# Patient Record
Sex: Female | Born: 2005 | Race: Black or African American | Hispanic: No | Marital: Single | Smoking: Never smoker
Health system: Southern US, Community
[De-identification: ages and names within clinical notes are randomized; demographics above are authoritative.]

## PROBLEM LIST (undated history)

## (undated) DIAGNOSIS — F419 Anxiety disorder, unspecified: Secondary | ICD-10-CM

## (undated) DIAGNOSIS — K589 Irritable bowel syndrome without diarrhea: Secondary | ICD-10-CM

## (undated) DIAGNOSIS — J45909 Unspecified asthma, uncomplicated: Secondary | ICD-10-CM

## (undated) HISTORY — DX: Unspecified asthma, uncomplicated: J45.909

## (undated) HISTORY — DX: Irritable bowel syndrome, unspecified: K58.9

## (undated) HISTORY — DX: Anxiety disorder, unspecified: F41.9

---
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
## 2023-01-01 ENCOUNTER — Ambulatory Visit (INDEPENDENT_AMBULATORY_CARE_PROVIDER_SITE_OTHER): Payer: Medicaid Other | Admitting: Licensed Clinical Social Worker

## 2023-01-01 DIAGNOSIS — F4323 Adjustment disorder with mixed anxiety and depressed mood: Secondary | ICD-10-CM

## 2023-01-01 DIAGNOSIS — F4320 Adjustment disorder, unspecified: Secondary | ICD-10-CM | POA: Diagnosis not present

## 2023-01-01 NOTE — BH Specialist Note (Signed)
Integrated Behavioral Health via Telemedicine Visit  01/01/2023 Madison Watson [id number]  Number of Integrated Behavioral Health Clinician visits: 2- Second Visit  Session Start time: [phone]   Session End time: 1059  Total time in minutes: 70   Referring Provider: Beatriz Stallion NP Patient/Family location: [place], [facility]  [facility] Provider location: [facility]  All persons participating in visit: Patient  Types of Service: Individual psychotherapy and Video visit  I connected with Madison Watson and/or Madison Watson's patient via  Telephone or Video Enabled Telemedicine Application  (Video is Caregility application) and verified that I am speaking with the correct person using two identifiers. Discussed confidentiality: Yes   I discussed the limitations of telemedicine and the availability of in person appointments.  Discussed there is a possibility of technology failure and discussed alternative modes of communication if that failure occurs.  I discussed that engaging in this telemedicine visit, they consent to the provision of behavioral healthcare and the services will be billed under their insurance.  Patient and/or legal guardian expressed understanding and consented to Telemedicine visit: Yes   Presenting Concerns: Patient and/or family reports the following symptoms/concerns: continued stress with adjustments to college, relationship stress   Duration of problem: months; Severity of problem: moderate  Patient and/or Family's Strengths/Protective Factors: Social connections, Concrete supports in place (healthy food, safe environments, etc.), and Close, supportive relationship with mother, History of IEP    Goals Addressed: Patient will: Reduce symptoms of: stress and inattention Increase knowledge and/or ability of: coping skills  Demonstrate ability to: Increase adequate support systems for patient/family through accessing accommodations for college    Progress  towards Goals: Ongoing   Interventions: Interventions utilized: Solution-Focused Strategies, Supportive Counseling, Psychoeducation and/or Health Education, and Supportive Reflection Standardized Assessments completed: Not Needed  Patient and/or Family Response: Patient worked to process recent conversation with friend about change of living arrangements and patient's emotions related to upcoming changes. Patient reported that she has been keeping to herself more in order to focus on her own goals and plans. Patient reported stress related to application to college and worked to process recent stressors within the family. Patient was able to identify positive coping strategies to manage stress. Patient engaged in discussion of recent request to transfer [facility] services to [facility], and options for other referrals if desired since location does not have openings. Patient reported interest in scheduling follow up with this [facility], but was uncertain of school scheduled. Patient agreed to send message if reschedule was needed.   Assessment: Patient currently experiencing continued stress with adjustment to college and relationships.   Patient may benefit from continued connection to behavioral health services to process emotions related to stressors and support positive coping and adjustments.  Plan: Follow up with behavioral health clinician on : 8/22 at 10 am virtually- patient messaged to indicate reschedule is needed  Behavioral recommendations: Continued to focus on yourself and your goals. Take time do activities that you enjoy Referral(s): Integrated [employer] (In Clinic). Discussed options for referral if desired  I discussed the assessment and treatment plan with the patient and/or parent/guardian. They were provided an opportunity to ask questions and all were answered. They agreed with the plan and demonstrated an understanding of the instructions.   They were advised to call  back or seek an in-person evaluation if the symptoms worsen or if the condition fails to improve as anticipated.  Madison Watson, [facility]

## 2023-01-05 ENCOUNTER — Other Ambulatory Visit (HOSPITAL_BASED_OUTPATIENT_CLINIC_OR_DEPARTMENT_OTHER): Payer: Self-pay | Admitting: Family Medicine

## 2023-01-05 MED ORDER — DESONIDE 0.05 % EX OINT: 1.0000 | TOPICAL_OINTMENT | Freq: Two times a day (BID) | CUTANEOUS | 2 refills | Status: AC

## 2023-01-05 MED ORDER — FLUOXETINE HCL 10 MG PO CAPS: 10.0000 mg | ORAL_CAPSULE | Freq: Every day | ORAL | 3 refills | Status: DC

## 2023-01-10 DIAGNOSIS — Z872 Personal history of diseases of the skin and subcutaneous tissue: Secondary | ICD-10-CM | POA: Diagnosis not present

## 2023-01-10 DIAGNOSIS — Z8709 Personal history of other diseases of the respiratory system: Secondary | ICD-10-CM | POA: Diagnosis not present

## 2023-01-10 DIAGNOSIS — T7840XA Allergy, unspecified, initial encounter: Secondary | ICD-10-CM | POA: Diagnosis not present

## 2023-01-10 DIAGNOSIS — Z9109 Other allergy status, other than to drugs and biological substances: Secondary | ICD-10-CM | POA: Diagnosis not present

## 2023-01-15 ENCOUNTER — Encounter: Payer: Medicaid Other | Admitting: Licensed Clinical Social Worker

## 2023-01-27 ENCOUNTER — Ambulatory Visit (HOSPITAL_BASED_OUTPATIENT_CLINIC_OR_DEPARTMENT_OTHER): Payer: Medicaid Other | Admitting: Family Medicine

## 2023-01-30 ENCOUNTER — Ambulatory Visit (HOSPITAL_BASED_OUTPATIENT_CLINIC_OR_DEPARTMENT_OTHER): Payer: Medicaid Other | Admitting: Family Medicine

## 2023-01-30 ENCOUNTER — Other Ambulatory Visit (HOSPITAL_BASED_OUTPATIENT_CLINIC_OR_DEPARTMENT_OTHER): Payer: Self-pay | Admitting: Family Medicine

## 2023-01-30 ENCOUNTER — Encounter (HOSPITAL_BASED_OUTPATIENT_CLINIC_OR_DEPARTMENT_OTHER): Payer: Self-pay | Admitting: Family Medicine

## 2023-01-30 VITALS — BP 138/94 | HR 62 | Ht 65.5 in | Wt 220.6 lb

## 2023-01-30 DIAGNOSIS — L819 Disorder of pigmentation, unspecified: Secondary | ICD-10-CM | POA: Diagnosis not present

## 2023-01-30 DIAGNOSIS — L210 Seborrhea capitis: Secondary | ICD-10-CM

## 2023-01-30 DIAGNOSIS — Z23 Encounter for immunization: Secondary | ICD-10-CM | POA: Diagnosis not present

## 2023-01-30 DIAGNOSIS — Z6836 Body mass index (BMI) 36.0-36.9, adult: Secondary | ICD-10-CM

## 2023-01-30 DIAGNOSIS — F419 Anxiety disorder, unspecified: Secondary | ICD-10-CM | POA: Diagnosis not present

## 2023-01-30 HISTORY — DX: Seborrhea capitis: L21.0

## 2023-01-30 MED ORDER — FLUOCINOLONE ACETONIDE BODY 0.01 % EX OIL: 1.0000 | TOPICAL_OIL | Freq: Every day | CUTANEOUS | 1 refills | Status: AC | PRN

## 2023-01-30 MED ORDER — HYDROQUINONE 4 % EX CREA: TOPICAL_CREAM | Freq: Every evening | CUTANEOUS | 0 refills | Status: DC

## 2023-01-30 NOTE — Assessment & Plan Note (Signed)
Discussed focusing on healthy lifestyle modifications- including dietary changes such as incorporating fresh fruits and vegetables, lean protein in the diet and reducing consumption of red meats, saturated fats, processed foods.  Also, recommend moderate walking, 3-5 times/week for 30-50 minutes each session or as much as you can tolerate.

## 2023-01-30 NOTE — Assessment & Plan Note (Signed)
Patient would like prescribed medication for dryness present on her scalp.

## 2023-01-30 NOTE — Progress Notes (Signed)
Established Patient Office Visit  Subjective   Patient ID: Madison Watson, female    DOB: [birth year]  Age: 17 y.o. MRN: [id number]  Madison Watson is a 17 year old female patient who presents today for anxiety follow-up.  Current medication regimen: prozac 10mg   Counseling: referred, but was unable to schedule with Irving Burton due to her insurance  Well controlled: yes, her anxiety has been "really low" and instead of having physical anxiety symptoms of shakiness/nervousness- she reports that she was excited for school  Anxiety symptoms are not noticeable with medication.  Denies SI/HI.      01/30/2023   10:25 AM 12/30/2022    9:11 AM 12/03/2022    1:58 PM 11/11/2022    9:58 AM  GAD 7 : Generalized Anxiety Score  Nervous, Anxious, on Edge 0 1 1 1   Control/stop worrying 0 1 1 1   Worry too much - different things 0 1 1 1   Trouble relaxing 1 1 -- 1  Restless 0 1 1 0  Easily annoyed or irritable 0 2 1 1   Afraid - awful might happen 0 2 1 1   Total GAD 7 Score 1 9  6   Anxiety Difficulty Somewhat difficult Somewhat difficult  Somewhat difficult      01/30/2023   10:25 AM 12/30/2022    9:10 AM 11/11/2022    9:58 AM  PHQ9 SCORE ONLY  PHQ-9 Total Score 1 5 4      Review of Systems  Constitutional:  Negative for malaise/fatigue.  Eyes:  Negative for blurred vision and double vision.  Respiratory:  Negative for cough and shortness of breath.   Cardiovascular:  Negative for chest pain, palpitations and leg swelling.  Gastrointestinal:  Negative for abdominal pain, nausea and vomiting.  Musculoskeletal:  Negative for myalgias.  Neurological:  Negative for dizziness, weakness and headaches.  Psychiatric/Behavioral:  Negative for depression, hallucinations, substance abuse and suicidal ideas. The patient is not nervous/anxious and does not have insomnia.      Objective:    BP (!) 138/94   Pulse 62   Ht 5' 5.5" (1.664 m)   Wt 220 lb 9.6 oz (100.1 kg)   SpO2 100%   BMI 36.15 kg/m  BP Readings  from Last 3 Encounters:  01/30/23 (!) 138/94  12/30/22 129/74  11/11/22 (!) 120/93     Physical Exam Constitutional:      Appearance: Normal appearance.  Cardiovascular:     Rate and Rhythm: Normal rate and regular rhythm.     Pulses: Normal pulses.     Heart sounds: Normal heart sounds.  Pulmonary:     Effort: Pulmonary effort is normal.     Breath sounds: Normal breath sounds.  Neurological:     Mental Status: She is alert.  Psychiatric:        Mood and Affect: Mood normal.        Behavior: Behavior normal.        Thought Content: Thought content normal.        Judgment: Judgment normal.       Assessment & Plan:  Anxiety Assessment & Plan: Patient reports her anxiety is well-controlled on current medication regimen (fluoxetine 10mg  daily). GAD7 completed with score of 1, PHQ9 completed with score of 1. Noticeable improvement in both scores since starting medication. Advised patient taking medication as prescribed and we will follow-up in 4 weeks.    Discoloration of skin Assessment & Plan: Patient presents today for concerns of skin darkening above her upper  lip that bothers her. She states that she is self-conscious of this and would like to trial a medication to help with this. Darkening is minimally noticeable. Prescribed hydroquinone. Discussed risk of hypopigmentation and advised patient to use sparing amount at bedtime. If no improvement with this regimen, advised patient that we can refer to dermatology.   Orders: -     Hydroquinone; Apply topically at bedtime. Apply topically at bedtime to affected area for 4 weeks.  Dispense: 28.35 g; Refill: 0  Dandruff Assessment & Plan: Patient would like prescribed medication for dryness present on her scalp.   Orders: -     Fluocinolone Acetonide Body; Apply 1 Application topically daily as needed (Daily as needed for dandruff flars).  Dispense: 118.28 mL; Refill: 1  Class 2 severe obesity due to excess calories with  serious comorbidity and body mass index (BMI) of 36.0 to 36.9 in adult [facility]) Assessment & Plan: Discussed focusing on healthy lifestyle modifications- including dietary changes such as incorporating fresh fruits and vegetables, lean protein in the diet and reducing consumption of red meats, saturated fats, processed foods.  Also, recommend moderate walking, 3-5 times/week for 30-50 minutes each session or as much as you can tolerate.   Encounter for immunization -     Flu vaccine trivalent PF, 6mos and older(Flulaval,Afluria,Fluarix,Fluzone)    Return in about 4 weeks (around 02/27/2023) for skin disorder & mood .    Alyson Reedy, FNP

## 2023-01-30 NOTE — Assessment & Plan Note (Signed)
Patient presents today for concerns of skin darkening above her upper lip that bothers her. She states that she is self-conscious of this and would like to trial a medication to help with this. Darkening is minimally noticeable. Prescribed hydroquinone. Discussed risk of hypopigmentation and advised patient to use sparing amount at bedtime. If no improvement with this regimen, advised patient that we can refer to dermatology.

## 2023-01-30 NOTE — Assessment & Plan Note (Signed)
Patient reports her anxiety is well-controlled on current medication regimen (fluoxetine 10mg  daily). GAD7 completed with score of 1, PHQ9 completed with score of 1. Noticeable improvement in both scores since starting medication. Advised patient taking medication as prescribed and we will follow-up in 4 weeks.

## 2023-02-05 ENCOUNTER — Ambulatory Visit (HOSPITAL_BASED_OUTPATIENT_CLINIC_OR_DEPARTMENT_OTHER): Payer: Medicaid Other | Admitting: Family Medicine

## 2023-02-09 ENCOUNTER — Ambulatory Visit (HOSPITAL_BASED_OUTPATIENT_CLINIC_OR_DEPARTMENT_OTHER): Payer: Medicaid Other | Admitting: Family Medicine

## 2023-02-27 ENCOUNTER — Ambulatory Visit (HOSPITAL_BASED_OUTPATIENT_CLINIC_OR_DEPARTMENT_OTHER): Payer: Medicaid Other | Admitting: Family Medicine

## 2023-03-06 ENCOUNTER — Encounter (HOSPITAL_BASED_OUTPATIENT_CLINIC_OR_DEPARTMENT_OTHER): Payer: Self-pay | Admitting: Family Medicine

## 2023-03-06 ENCOUNTER — Other Ambulatory Visit (HOSPITAL_BASED_OUTPATIENT_CLINIC_OR_DEPARTMENT_OTHER): Payer: Self-pay

## 2023-03-06 ENCOUNTER — Ambulatory Visit (HOSPITAL_BASED_OUTPATIENT_CLINIC_OR_DEPARTMENT_OTHER): Payer: Medicaid Other | Admitting: Family Medicine

## 2023-03-06 VITALS — BP 138/83 | HR 64 | Ht 65.5 in | Wt 225.1 lb

## 2023-03-06 DIAGNOSIS — L2082 Flexural eczema: Secondary | ICD-10-CM

## 2023-03-06 DIAGNOSIS — L309 Dermatitis, unspecified: Secondary | ICD-10-CM

## 2023-03-06 DIAGNOSIS — Z113 Encounter for screening for infections with a predominantly sexual mode of transmission: Secondary | ICD-10-CM | POA: Diagnosis not present

## 2023-03-06 DIAGNOSIS — L210 Seborrhea capitis: Secondary | ICD-10-CM

## 2023-03-06 DIAGNOSIS — E66812 Obesity, class 2: Secondary | ICD-10-CM | POA: Diagnosis not present

## 2023-03-06 DIAGNOSIS — Z6836 Body mass index (BMI) 36.0-36.9, adult: Secondary | ICD-10-CM

## 2023-03-06 DIAGNOSIS — L732 Hidradenitis suppurativa: Secondary | ICD-10-CM

## 2023-03-06 DIAGNOSIS — L819 Disorder of pigmentation, unspecified: Secondary | ICD-10-CM

## 2023-03-06 HISTORY — DX: Encounter for screening for infections with a predominantly sexual mode of transmission: Z11.3

## 2023-03-06 NOTE — Assessment & Plan Note (Signed)
Patient presents today for concerns regarding skin dryness/cracking. She reports the possible eczema on her upper lipid has improved, but she is still experiencing excessive dryness on her upper lip. She reports using various moisturizers with little improvement. Eczema rash has improved but cracking above lip is present on exam. Referral placed to dermatology.

## 2023-03-06 NOTE — Progress Notes (Signed)
   Established Patient Office Visit  Subjective   Patient ID: Madison Watson, female    DOB: 26-Mar-2006  Age: 17 y.o. MRN: [id number]  Madison Watson is a 17 year-old female who presents today for concerns about her skin and vaginal itching. She reports improvement in the rash on her upper lip with using triamcinolone cream sparingly. However, she reports her upper lip is still itchy and dry. She has been using cocoa butter but not helping moisturize and improve the cracking.   She has concerns today about vaginal itching and would also like STI testing. Denies vaginal discharge, vaginal odor, dysuria, frequency/urgency, hematuria, flank pain.   Review of Systems  Constitutional:  Negative for chills and fever.  Respiratory:  Negative for cough and shortness of breath.   Cardiovascular:  Negative for chest pain, palpitations and leg swelling.  Gastrointestinal:  Negative for abdominal pain, nausea and vomiting.  Genitourinary:  Negative for dysuria, flank pain, frequency, hematuria and urgency.  Skin:  Positive for rash (upper lip).  Neurological:  Negative for dizziness, weakness and headaches.    Objective:    BP 138/83   Pulse 64   Ht 5' 5.5" (1.664 m)   Wt 225 lb 1.6 oz (102.1 kg)   SpO2 100%   BMI 36.89 kg/m  BP Readings from Last 3 Encounters:  03/06/23 138/83  01/30/23 (!) 138/94  12/30/22 129/74    Physical Exam Constitutional:      Appearance: Normal appearance.  Cardiovascular:     Rate and Rhythm: Normal rate and regular rhythm.     Pulses: Normal pulses.     Heart sounds: Normal heart sounds.  Pulmonary:     Effort: Pulmonary effort is normal.     Breath sounds: Normal breath sounds.  Abdominal:     Tenderness: There is no right CVA tenderness or left CVA tenderness.  Neurological:     Mental Status: She is alert.  Psychiatric:        Mood and Affect: Mood normal.        Behavior: Behavior normal.     Assessment & Plan:  Lip-licking eczema Assessment &  Plan: Patient presents today for concerns regarding skin dryness/cracking. She reports the possible eczema on her upper lipid has improved, but she is still experiencing excessive dryness on her upper lip. She reports using various moisturizers with little improvement. Eczema rash has improved but cracking above lip is present on exam. Referral placed to dermatology.    Screen for sexually transmitted diseases Assessment & Plan: Patient reports vaginal itching. Denies other urinary sxs, vaginal discharge, vaginal odor, lesions, rash. She would like STI testing today. Will update patient with results.   Orders: -     Ct, Ng, Mycoplasmas NAA, Urine -     HIV Antibody (routine testing w rflx) -     RPR -     NuSwab Vaginitis Plus (VG+)  Class 2 severe obesity due to excess calories with serious comorbidity and body mass index (BMI) of 36.0 to 36.9 in adult [facility]) Assessment & Plan: Discussed lifestyle modifications- including healthy diet and daily exercise.       Return if symptoms worsen or fail to improve.    Alyson Reedy, FNP

## 2023-03-06 NOTE — Assessment & Plan Note (Signed)
Patient reports vaginal itching. Denies other urinary sxs, vaginal discharge, vaginal odor, lesions, rash. She would like STI testing today. Will update patient with results.

## 2023-03-06 NOTE — Assessment & Plan Note (Signed)
Discussed lifestyle modifications- including healthy diet and daily exercise.

## 2023-03-09 ENCOUNTER — Other Ambulatory Visit (HOSPITAL_BASED_OUTPATIENT_CLINIC_OR_DEPARTMENT_OTHER): Payer: Self-pay | Admitting: Family Medicine

## 2023-03-09 LAB — NUSWAB VAGINITIS PLUS (VG+)
Candida albicans, NAA: NEGATIVE
Candida glabrata, NAA: NEGATIVE
Chlamydia trachomatis, NAA: NEGATIVE
Neisseria gonorrhoeae, NAA: NEGATIVE
Trich vag by NAA: NEGATIVE

## 2023-03-09 LAB — HIV ANTIBODY (ROUTINE TESTING W REFLEX): HIV Screen 4th Generation wRfx: NONREACTIVE

## 2023-03-09 LAB — CT, NG, MYCOPLASMAS NAA, URINE
Chlamydia trachomatis, NAA: NEGATIVE
Mycoplasma genitalium NAA: NEGATIVE
Mycoplasma hominis NAA: NEGATIVE
Neisseria gonorrhoeae, NAA: NEGATIVE
Ureaplasma spp NAA: POSITIVE — AB

## 2023-03-09 LAB — RPR: RPR Ser Ql: NONREACTIVE

## 2023-03-09 MED ORDER — DOXYCYCLINE HYCLATE 100 MG PO TABS
100.0000 mg | ORAL_TABLET | Freq: Two times a day (BID) | ORAL | 0 refills | Status: AC
Start: 2023-03-09 — End: 2023-03-16

## 2023-03-21 DIAGNOSIS — H5213 Myopia, bilateral: Secondary | ICD-10-CM | POA: Diagnosis not present

## 2023-04-16 ENCOUNTER — Encounter (HOSPITAL_BASED_OUTPATIENT_CLINIC_OR_DEPARTMENT_OTHER): Payer: Self-pay | Admitting: Family Medicine

## 2023-04-21 ENCOUNTER — Other Ambulatory Visit (HOSPITAL_BASED_OUTPATIENT_CLINIC_OR_DEPARTMENT_OTHER): Payer: Self-pay | Admitting: Family Medicine

## 2023-04-27 ENCOUNTER — Ambulatory Visit (INDEPENDENT_AMBULATORY_CARE_PROVIDER_SITE_OTHER): Payer: Medicaid Other | Admitting: Dermatology

## 2023-04-27 ENCOUNTER — Encounter: Payer: Self-pay | Admitting: Dermatology

## 2023-04-27 DIAGNOSIS — L732 Hidradenitis suppurativa: Secondary | ICD-10-CM | POA: Diagnosis not present

## 2023-04-27 DIAGNOSIS — L24A1 Irritant contact dermatitis due to saliva: Secondary | ICD-10-CM

## 2023-04-27 DIAGNOSIS — L720 Epidermal cyst: Secondary | ICD-10-CM

## 2023-04-27 DIAGNOSIS — Z79899 Other long term (current) drug therapy: Secondary | ICD-10-CM

## 2023-04-27 DIAGNOSIS — Z7189 Other specified counseling: Secondary | ICD-10-CM | POA: Diagnosis not present

## 2023-04-27 DIAGNOSIS — L309 Dermatitis, unspecified: Secondary | ICD-10-CM

## 2023-04-27 MED ORDER — DUPIXENT 300 MG/2ML ~~LOC~~ SOAJ: 300.0000 mg | SUBCUTANEOUS | 5 refills | Status: DC

## 2023-04-27 MED ORDER — DUPILUMAB 300 MG/2ML ~~LOC~~ SOSY
300.0000 mg | PREFILLED_SYRINGE | Freq: Once | SUBCUTANEOUS | Status: AC
  Administered 2023-04-27: 300 mg via SUBCUTANEOUS

## 2023-04-27 MED ORDER — DUPILUMAB 300 MG/2ML ~~LOC~~ SOSY: 300.0000 mg | PREFILLED_SYRINGE | Freq: Once | SUBCUTANEOUS | Status: DC

## 2023-04-27 MED ORDER — ELIDEL 1 % EX CREA: TOPICAL_CREAM | Freq: Two times a day (BID) | CUTANEOUS | 0 refills | Status: DC

## 2023-04-27 MED ORDER — DUPIXENT 300 MG/2ML ~~LOC~~ SOSY: 300.0000 mg | PREFILLED_SYRINGE | SUBCUTANEOUS | 5 refills | Status: DC

## 2023-04-27 NOTE — Progress Notes (Unsigned)
New Patient Visit   Subjective  Madison Watson is a 17 y.o. female who presents for the following: discoloration at upper lip since 2020. Patient has been using TMC 0.1% cream PRN, hydroquinone 4% cream and clindamycin gel. She said the upper lip does itch and she licks her lips. Also uses TMC 0.1% cream at arms, popliteal and neck for eczema. Hx of eczema as a child.  Patient also with blackheads at groin and underarms that sometimes drain, go away and then come back. A few years ago she was going to have surgery but then she was given a liquid to put on it to help heal it together. Patient did take spironolactone 50 mg and doxycycline 100 mg twice daily but the doxycycline gave her upset stomach.   Patient accompanied by mother who contributes to history.    The following portions of the chart were reviewed this encounter and updated as appropriate: medications, allergies, medical history  Review of Systems:  No other skin or systemic complaints except as noted in HPI or Assessment and Plan.  Objective  Well appearing patient in no apparent distress; mood and affect are within normal limits.   A focused examination was performed of the following areas: Neck, face, axilla, legs  Relevant exam findings are noted in the Assessment and Plan.  Right Antecubital Fossa Lichenified hyperpigmented plaque of upper cutaneous lip. Hyperpigmented scaly patches of bilateral AC and popliteal fossae. Hypopigmentation of upper vermilion lip and lower vermilion lip border; does not fluoresce with wood's lamp. 5% BSA    Assessment & Plan   HIDRADENITIS SUPPURATIVA Exam: inflamed draining sinus tracts of bilateral axillae  Flared  Hidradenitis Suppurativa is a chronic; persistent; non-curable, but treatable condition due to abnormal inflamed sweat glands in the body folds (axilla, inframammary, groin, medial thighs), causing recurrent painful draining cysts and scarring. It can be associated  with severe scarring acne and cysts; also abscesses and scarring of scalp. The goal is control and prevention of flares, as it is not curable. Scars are permanent and can be thickened. Treatment may include daily use of topical medication and oral antibiotics.  Oral isotretinoin may also be helpful.  For some cases, Humira or Cosentyx (biologic injections) may be prescribed to decrease the inflammatory process and prevent flares.  When indicated, inflamed cysts may also be treated surgically.  Treatment Plan: No hx of UC or IBD. Pending labs start Cosentyx.  Reviewed risks of biologics including immunosuppression, infections, injection site reaction, and failure to improve condition. Goal is control of skin condition, not cure.  Some older biologics such as Humira and Enbrel may slightly increase risk of malignancy and may worsen congestive heart failure.  Taltz and Cosentyx may cause inflammatory bowel disease to flare. The use of biologics requires long term medication management, including periodic office visits and monitoring of blood work.   Eczema, unspecified type Right Antecubital Fossa  Chronic severe flaring not at patient goal Failed elidel 02/2018 but resending in case insurance requests re-trial Failed clobetasol  Will try to control eczema so patient has less of a trigger to lick and perpetuate the lip lesion Stop using hydroquinone cream.  Stop licking lips Start Elidel twice daily to area at upper lip.  Start Dupixent 300 mg/46mL today. Samples of Dupixent 300 mg/44mL x 2 injected today to B/L upper arms.  Lot # [id number]  Exp: 12/2024    Related Medications ELIDEL 1 % cream Apply topically 2 (two) times daily.  Dupilumab (DUPIXENT)  300 MG/2ML SOAJ Inject 300 mg into the skin every 14 (fourteen) days. Starting at day 15 for maintenance.  dupilumab (DUPIXENT) prefilled syringe 300 mg   dupilumab (DUPIXENT) prefilled syringe 300 mg   EIC (epidermal inclusion  cyst)  Hidradenitis suppurativa  Irritant contact dermatitis due to saliva  Long-term use of high-risk medication  Related Procedures Comprehensive metabolic panel Lipid panel CBC with Differential/Platelet Hepatitis B surface antigen Hepatitis B surface antibody,qualitative Hepatitis C antibody HIV Antibody (routine testing w rflx) Hepatitis B core antibody, total QuantiFERON-TB Gold Plus  Counseling and coordination of care  EPIDERMAL INCLUSION CYST Exam: Subcutaneous nodule at posterior neck  Benign-appearing. Exam most consistent with an epidermal inclusion cyst. Discussed that a cyst is a benign growth that can grow over time and sometimes get irritated or inflamed. Recommend observation if it is not bothersome. Discussed option of surgical excision to remove it if it is growing, symptomatic, or other changes noted. Please call for new or changing lesions so they can be evaluated.     Return in about 4 weeks (around 05/25/2023) for Hidradenitis, Eczema, Dupixent.  [name], RMA, am acting as scribe for Elie Goody, MD .   Documentation: I have reviewed the above documentation for accuracy and completeness, and I agree with the above.  Elie Goody, MD

## 2023-04-27 NOTE — Patient Instructions (Addendum)
Stop using hydroquinone cream.   Start Elidel twice daily to area at upper lip.  Reviewed risks of biologics including immunosuppression, infections, injection site reaction, and failure to improve condition. Goal is control of skin condition, not cure.  Some older biologics such as Humira and Enbrel may slightly increase risk of malignancy and may worsen congestive heart failure.  Taltz and Cosentyx may cause inflammatory bowel disease to flare. The use of biologics requires long term medication management, including periodic office visits and monitoring of blood work.  Dupilumab (Dupixent) is a treatment given by injection for adults and children with moderate-to-severe atopic dermatitis. Goal is control of skin condition, not cure. It is given as 2 injections at the first dose followed by 1 injection ever 2 weeks thereafter.  Young children are dosed monthly.  Potential side effects include allergic reaction, herpes infections, injection site reactions and conjunctivitis (inflammation of the eyes).  The use of Dupixent requires long term medication management, including periodic office visits.  Due to recent changes in healthcare laws, you may see results of your pathology and/or laboratory studies on MyChart before the doctors have had a chance to review them. We understand that in some cases there may be results that are confusing or concerning to you. Please understand that not all results are received at the same time and often the doctors may need to interpret multiple results in order to provide you with the best plan of care or course of treatment. Therefore, we ask that you please give [place] 2 business days to thoroughly review all your results before contacting the office for clarification. Should we see a critical lab result, you will be contacted sooner.   If You Need Anything After Your Visit  If you have any questions or concerns for your doctor, please call our main line at [phone]  and press option 4 to reach your doctor's medical assistant. If no one answers, please leave a voicemail as directed and we will return your call as soon as possible. Messages left after 4 pm will be answered the following business day.   You may also send [place] a message via MyChart. We typically respond to MyChart messages within 1-2 business days.  For prescription refills, please ask your pharmacy to contact our office. Our fax number is [phone].  If you have an urgent issue when the clinic is closed that cannot wait until the next business day, you can page your doctor at the number below.    Please note that while we do our best to be available for urgent issues outside of office hours, we are not available 24/7.   If you have an urgent issue and are unable to reach [place], you may choose to seek medical care at your doctor's office, retail clinic, urgent care center, or emergency room.  If you have a medical emergency, please immediately call 911 or go to the emergency department.  Pager Numbers  - Dr. Gwen Pounds: [phone]  - Dr. Roseanne Reno: [phone]  - Dr. Katrinka Blazing: [phone]   In the event of inclement weather, please call our main line at [phone] for an update on the status of any delays or closures.  Dermatology Medication Tips: Please keep the boxes that topical medications come in in order to help keep track of the instructions about where and how to use these. Pharmacies typically print the medication instructions only on the boxes and not directly on the medication tubes.   If your medication is too expensive,  please contact our office at [phone] option 4 or send [place] a message through MyChart.   We are unable to tell what your co-pay for medications will be in advance as this is different depending on your insurance coverage. However, we may be able to find a substitute medication at lower cost or fill out paperwork to get insurance to cover a needed medication.    If a prior authorization is required to get your medication covered by your insurance company, please allow [place] 1-2 business days to complete this process.  Drug prices often vary depending on where the prescription is filled and some pharmacies may offer cheaper prices.  The website www.goodrx.com contains coupons for medications through different pharmacies. The prices here do not account for what the cost may be with help from insurance (it may be cheaper with your insurance), but the website can give you the price if you did not use any insurance.  - You can print the associated coupon and take it with your prescription to the pharmacy.  - You may also stop by our office during regular business hours and pick up a GoodRx coupon card.  - If you need your prescription sent electronically to a different pharmacy, notify our office through [facility] or by phone at [phone] option 4.     Si Usted Necesita Algo Despus de Su Visita  Tambin puede enviarnos un mensaje a travs de Clinical cytogeneticist. Por lo general respondemos a los mensajes de MyChart en el transcurso de 1 a 2 das hbiles.  Para renovar recetas, por favor pida a su farmacia que se ponga en contacto con nuestra oficina. Annie Sable de fax es [place] [phone].  Si tiene un asunto urgente cuando la clnica est cerrada y que no puede esperar hasta el siguiente da hbil, puede llamar/localizar a su doctor(a) al nmero que aparece a continuacin.   Por favor, tenga en cuenta que aunque hacemos todo lo posible para estar disponibles para asuntos urgentes fuera del horario de [place], no estamos disponibles las 24 horas del da, los 7 [address]  [address] de la [place].   Si tiene un problema urgente y no puede comunicarse con nosotros, puede optar por buscar atencin mdica  en el consultorio de su doctor(a), en una clnica privada, en un centro de atencin urgente o en una sala de emergencias.  Si tiene Engineer, drilling, por favor  llame inmediatamente al 911 o vaya a la sala de emergencias.  Nmeros de bper  - Dr. Gwen Pounds: [phone]  - Dra. Roseanne Reno: [phone]  - Dr. Katrinka Blazing: [phone]   En caso de inclemencias del tiempo, por favor llame a Lacy Duverney principal al [phone] para una actualizacin sobre el [place] de cualquier retraso o cierre.  Consejos para la medicacin en dermatologa: Por favor, guarde las cajas en las que vienen los medicamentos de uso tpico para ayudarle a seguir las instrucciones sobre dnde y cmo usarlos. Las farmacias generalmente imprimen las instrucciones del medicamento slo en las cajas y no directamente en los tubos del [place].   Si su medicamento es muy caro, por favor, pngase en contacto con Rolm [place]ala llamando al [phone] y presione la opcin 4 o envenos un mensaje a travs de Clinical cytogeneticist.   No podemos decirle cul ser su copago por los medicamentos por adelantado ya que esto es diferente dependiendo de la cobertura de su seguro. Sin embargo, es posible que podamos encontrar un medicamento sustituto a Audiological scientist un formulario para que el seguro  cubra el medicamento que se considera necesario.   Si se requiere una autorizacin previa para que su compaa de seguros [place] su medicamento, por favor permtanos de 1 a 2 das hbiles para completar [address]  Los precios de los medicamentos varan con frecuencia dependiendo del Environmental consultant de dnde se surte la receta y alguna farmacias pueden ofrecer precios ms baratos.  El sitio web www.goodrx.com tiene cupones para medicamentos de Health and safety inspector. Los precios aqu no tienen en cuenta lo que podra costar con la ayuda del seguro (puede ser ms barato con su seguro), pero el sitio web puede darle el precio si no utiliz Tourist information centre manager.  - Puede imprimir el cupn correspondiente y llevarlo con su receta a la farmacia.  - Tambin puede pasar por nuestra oficina durante el horario de atencin regular y  Education officer, museum una tarjeta de cupones de GoodRx.  - Si necesita que su receta se enve electrnicamente a una farmacia diferente, informe a nuestra oficina a travs de MyChart de [place] o por telfono llamando al [phone] y presione la opcin 4.

## 2023-04-28 ENCOUNTER — Encounter: Payer: Self-pay | Admitting: Dermatology

## 2023-04-28 DIAGNOSIS — L309 Dermatitis, unspecified: Secondary | ICD-10-CM | POA: Diagnosis not present

## 2023-04-30 ENCOUNTER — Other Ambulatory Visit: Payer: Self-pay | Admitting: Dermatology

## 2023-04-30 DIAGNOSIS — L309 Dermatitis, unspecified: Secondary | ICD-10-CM

## 2023-04-30 LAB — CBC WITH DIFFERENTIAL/PLATELET
Basophils Absolute: 0 10*3/uL (ref 0.0–0.2)
Basos: 0 %
EOS (ABSOLUTE): 0.1 10*3/uL (ref 0.0–0.4)
Eos: 1 %
Hematocrit: 41.6 % (ref 34.0–46.6)
Hemoglobin: 13.6 g/dL (ref 11.1–15.9)
Immature Grans (Abs): 0 10*3/uL (ref 0.0–0.1)
Immature Granulocytes: 0 %
Lymphocytes Absolute: 3.2 10*3/uL — ABNORMAL HIGH (ref 0.7–3.1)
Lymphs: 45 %
MCH: 29.8 pg (ref 26.6–33.0)
MCHC: 32.7 g/dL (ref 31.5–35.7)
MCV: 91 fL (ref 79–97)
Monocytes Absolute: 0.6 10*3/uL (ref 0.1–0.9)
Monocytes: 9 %
Neutrophils Absolute: 3.2 10*3/uL (ref 1.4–7.0)
Neutrophils: 45 %
Platelets: 157 10*3/uL (ref 150–450)
RBC: 4.57 x10E6/uL (ref 3.77–5.28)
RDW: 12.7 % (ref 11.7–15.4)
WBC: 7.1 10*3/uL (ref 3.4–10.8)

## 2023-04-30 LAB — HEPATITIS C ANTIBODY: Hep C Virus Ab: NONREACTIVE

## 2023-04-30 LAB — HEPATITIS B SURFACE ANTIGEN: Hepatitis B Surface Ag: NEGATIVE

## 2023-04-30 LAB — COMPREHENSIVE METABOLIC PANEL
ALT: 25 [IU]/L (ref 0–32)
AST: 23 [IU]/L (ref 0–40)
Albumin: 4.3 g/dL (ref 4.0–5.0)
Alkaline Phosphatase: 50 [IU]/L (ref 42–106)
BUN/Creatinine Ratio: 10 (ref 9–23)
BUN: 8 mg/dL (ref 6–20)
Bilirubin Total: 0.3 mg/dL (ref 0.0–1.2)
CO2: 22 mmol/L (ref 20–29)
Calcium: 9.4 mg/dL (ref 8.7–10.2)
Chloride: 105 mmol/L (ref 96–106)
Creatinine, Ser: 0.84 mg/dL (ref 0.57–1.00)
Globulin, Total: 2.6 g/dL (ref 1.5–4.5)
Glucose: 91 mg/dL (ref 70–99)
Potassium: 4.2 mmol/L (ref 3.5–5.2)
Sodium: 141 mmol/L (ref 134–144)
Total Protein: 6.9 g/dL (ref 6.0–8.5)
eGFR: 103 mL/min/{1.73_m2} (ref >59)

## 2023-04-30 LAB — QUANTIFERON-TB GOLD PLUS
QuantiFERON Mitogen Value: >10 [IU]/mL
QuantiFERON Nil Value: 0.15 [IU]/mL
QuantiFERON TB1 Ag Value: 0.16 [IU]/mL
QuantiFERON TB2 Ag Value: 0.17 [IU]/mL
QuantiFERON-TB Gold Plus: NEGATIVE

## 2023-04-30 LAB — HEPATITIS B SURFACE ANTIBODY,QUALITATIVE: Hep B Surface Ab, Qual: NONREACTIVE

## 2023-04-30 LAB — LIPID PANEL
Chol/HDL Ratio: 3.8 {ratio} (ref 0.0–4.4)
Cholesterol, Total: 202 mg/dL — ABNORMAL HIGH (ref 100–169)
HDL: 53 mg/dL (ref >39)
LDL Chol Calc (NIH): 109 mg/dL (ref 0–109)
Triglycerides: 231 mg/dL — ABNORMAL HIGH (ref 0–89)
VLDL Cholesterol Cal: 40 mg/dL (ref 5–40)

## 2023-04-30 LAB — HIV ANTIBODY (ROUTINE TESTING W REFLEX): HIV Screen 4th Generation wRfx: NONREACTIVE

## 2023-04-30 NOTE — Telephone Encounter (Signed)
Will do appeal. Area being treated is at face and topical steroids are contraindicated.

## 2023-05-04 ENCOUNTER — Telehealth: Payer: Self-pay

## 2023-05-04 ENCOUNTER — Encounter (HOSPITAL_BASED_OUTPATIENT_CLINIC_OR_DEPARTMENT_OTHER): Payer: Self-pay | Admitting: Family Medicine

## 2023-05-04 ENCOUNTER — Ambulatory Visit (HOSPITAL_BASED_OUTPATIENT_CLINIC_OR_DEPARTMENT_OTHER): Payer: Medicaid Other | Admitting: Family Medicine

## 2023-05-04 VITALS — BP 136/75 | HR 72 | Wt 235.0 lb

## 2023-05-04 DIAGNOSIS — L2082 Flexural eczema: Secondary | ICD-10-CM

## 2023-05-04 DIAGNOSIS — H6122 Impacted cerumen, left ear: Secondary | ICD-10-CM

## 2023-05-04 DIAGNOSIS — F419 Anxiety disorder, unspecified: Secondary | ICD-10-CM | POA: Diagnosis not present

## 2023-05-04 NOTE — Addendum Note (Signed)
Addended by: Alyson Reedy on: 05/04/2023 09:36 AM   Modules accepted: Level of Service

## 2023-05-04 NOTE — Patient Instructions (Signed)
*  recommend getting Debrox to help with ear wax

## 2023-05-04 NOTE — Telephone Encounter (Signed)
-----   Message from [place] sent at 05/04/2023  3:22 PM EST ----- Please call to schedule nurse visit for cosentyx samples and prescribe Cosentyx 300 mg at weeks 0, 1, 2, 3, and 4 followed by 300 mg every 4 weeks for hidradenitis suppurativa

## 2023-05-04 NOTE — Progress Notes (Signed)
Established Patient Office Visit  Subjective   Patient ID: Madison Watson, female    DOB: [birth year]  Age: 17 y.o. MRN: [id number]  Chief Complaint  Patient presents with   Ear Fullness    Left ear, feels like it may have fluid in it. Bothering for about a month   ANXIETY: Madison Watson is a 17 year old female patient who presents for the medical management of anxiety.  Current medication regimen: fluoxetine 10mg  daily  Well controlled: yes, can tell a difference when she misses a dose  Denies SI/HI.      05/04/2023    8:54 AM 01/30/2023   10:25 AM 12/30/2022    9:11 AM 12/03/2022    1:58 PM  GAD 7 : Generalized Anxiety Score  Nervous, Anxious, on Edge 0 0 1 1  Control/stop worrying 0 0 1 1  Worry too much - different things 0 0 1 1  Trouble relaxing 0 1 1 --  Restless 0 0 1 1  Easily annoyed or irritable 1 0 2 1  Afraid - awful might happen 1 0 2 1  Total GAD 7 Score 2 1 9    Anxiety Difficulty Not difficult at all Somewhat difficult Somewhat difficult       05/04/2023    8:53 AM 01/30/2023   10:25 AM 12/30/2022    9:10 AM  PHQ9 SCORE ONLY  PHQ-9 Total Score 2 1 5    L EAR DISCOMFORT: can hear a little bit of fluid, would feel like her ear would make a sound Denies muffled sounds, pain, ear discharge/drainage, headache. Reports noticing this sensation for the past month  Has been going on for the past month   ECZEMA: has seen derm, noticed relief with Elidel & Dupixent   Review of Systems  Constitutional:  Negative for malaise/fatigue.  HENT:  Negative for ear discharge, ear pain, hearing loss and tinnitus.   Respiratory:  Negative for cough and shortness of breath.   Cardiovascular:  Negative for chest pain and palpitations.  Gastrointestinal:  Negative for nausea and vomiting.  Neurological:  Negative for dizziness and headaches.  Psychiatric/Behavioral:  Negative for depression, hallucinations and suicidal ideas. The patient is not nervous/anxious.      Objective:      BP 136/75   Pulse 72   Wt 235 lb (106.6 kg)   SpO2 100%   BMI 38.51 kg/m  BP Readings from Last 3 Encounters:  05/04/23 136/75  03/06/23 138/83  01/30/23 (!) 138/94     Physical Exam Vitals reviewed.  Constitutional:      Appearance: Normal appearance.  HENT:     Right Ear: Hearing, tympanic membrane, ear canal and external ear normal.     Left Ear: Hearing normal. There is impacted cerumen.  Cardiovascular:     Rate and Rhythm: Normal rate and regular rhythm.     Pulses: Normal pulses.     Heart sounds: Normal heart sounds.  Pulmonary:     Effort: Pulmonary effort is normal.     Breath sounds: Normal breath sounds.  Neurological:     Mental Status: She is alert.  Psychiatric:        Mood and Affect: Mood normal.        Behavior: Behavior normal.     Assessment & Plan:   1. Anxiety Patient is a pleasant 17 year old female patient who presents for medical management of anxiety. GAD7 completed today with score of 2. Patient reports her mood is well-controlled on current  medication regimen- fluoxetine 10mg  daily. Denies adverse side effects. No refills needed.   2. Impacted cerumen of left ear Patient reports abnormal sensation in her left ear. Physical exam with impacted cerumen of her left ear. Ear lavage performed in office. Discussed use of Debrox on a daily basis to prevent impaction. Patient verbalized understanding and plans to return to office if no improvement with OTC regimen.   3. Flexural eczema Followed by dermatology.   Return in about 3 months (around 08/02/2023) for Mood f/u.    Alyson Reedy, FNP

## 2023-05-05 ENCOUNTER — Telehealth: Payer: Self-pay

## 2023-05-05 NOTE — Telephone Encounter (Signed)
Discussed Elidel PA denial with insurance company. I contact  [employer] and their representative said that the PA was for generic Pimecrolimus and not brand name Elidel. She then switched the PA to brand name Elidel and said they did receive information about her trying and failing [facility]. She said turnaround time for approval is 24 hours. Madison Watson RMA spoke with patient and advised her of Elidel approval delay.

## 2023-05-14 ENCOUNTER — Ambulatory Visit: Payer: Medicaid Other

## 2023-05-23 ENCOUNTER — Other Ambulatory Visit: Payer: Self-pay | Admitting: Dermatology

## 2023-05-23 DIAGNOSIS — L309 Dermatitis, unspecified: Secondary | ICD-10-CM

## 2023-05-25 ENCOUNTER — Encounter: Payer: Self-pay | Admitting: Dermatology

## 2023-05-25 ENCOUNTER — Ambulatory Visit: Payer: Medicaid Other | Admitting: Dermatology

## 2023-05-25 DIAGNOSIS — Z79899 Other long term (current) drug therapy: Secondary | ICD-10-CM

## 2023-05-25 DIAGNOSIS — L732 Hidradenitis suppurativa: Secondary | ICD-10-CM

## 2023-05-25 DIAGNOSIS — L209 Atopic dermatitis, unspecified: Secondary | ICD-10-CM

## 2023-05-25 DIAGNOSIS — L720 Epidermal cyst: Secondary | ICD-10-CM

## 2023-05-25 DIAGNOSIS — Z7189 Other specified counseling: Secondary | ICD-10-CM | POA: Diagnosis not present

## 2023-05-25 DIAGNOSIS — L309 Dermatitis, unspecified: Secondary | ICD-10-CM

## 2023-05-25 MED ORDER — SECUKINUMAB 300 MG/2ML ~~LOC~~ SOAJ
300.0000 mg | Freq: Once | SUBCUTANEOUS | Status: AC
  Administered 2023-05-25: 300 mg via SUBCUTANEOUS

## 2023-05-25 MED ORDER — EUCRISA 2 % EX OINT: TOPICAL_OINTMENT | CUTANEOUS | 2 refills | Status: DC

## 2023-05-25 MED ORDER — COSENTYX SENSOREADY (300 MG) 150 MG/ML ~~LOC~~ SOAJ: 300.0000 mg | SUBCUTANEOUS | 5 refills | Status: DC

## 2023-05-25 MED ORDER — COSENTYX SENSOREADY (300 MG) 150 MG/ML ~~LOC~~ SOAJ: 300.0000 mg | SUBCUTANEOUS | 0 refills | Status: DC

## 2023-05-25 NOTE — Progress Notes (Signed)
Follow-Up Visit   Subjective  Madison Watson is a 17 y.o. female who presents for the following: eczema follow up. Patient has started Dupixent and is doing injections at home. She was not able to get the Elidel due to issues with the pharmacy and is using Vaseline. She has used TMC at upper lip. Also using [facility] 0.1% cream as needed.   The patient has spots, moles and lesions to be evaluated, some may be new or changing and the patient may have concern these could be cancer.   The following portions of the chart were reviewed this encounter and updated as appropriate: medications, allergies, medical history  Review of Systems:  No other skin or systemic complaints except as noted in HPI or Assessment and Plan.  Objective  Well appearing patient in no apparent distress; mood and affect are within normal limits.   A focused examination was performed of the following areas: face  Relevant exam findings are noted in the Assessment and Plan.    Assessment & Plan   ATOPIC DERMATITIS Exam: ac and popliteal fossae improved with residual PIH. Hyperpigmented lichenified plaque of upper cutaneous lip slightly improved 2%BSA  Chronic and persistent condition with duration or expected duration over one year. Condition is symptomatic/ bothersome to patient. Not currently at goal.   Atopic dermatitis (eczema) is a chronic, relapsing, pruritic condition that can significantly affect quality of life. It is often associated with allergic rhinitis and/or asthma and can require treatment with topical medications, phototherapy, or in severe cases biologic injectable medication (Dupixent; Adbry) or Oral JAK inhibitors.  Treatment Plan: Continue Dupixent 300 mg/55mL SQ Q2 wks.  Start Eucrisa 1-2 times daily to affected areas upper lip. Samples given. Lot # [facility]  Exp: 06/2024  Recommend gentle skin care.  Dupilumab (Dupixent) is a treatment given by injection for adults and children with  moderate-to-severe atopic dermatitis. Goal is control of skin condition, not cure. It is given as 2 injections at the first dose followed by 1 injection ever 2 weeks thereafter.  Young children are dosed monthly.  Potential side effects include allergic reaction, herpes infections, injection site reactions and conjunctivitis (inflammation of the eyes).  The use of Dupixent requires long term medication management, including periodic office visits.    HIDRADENITIS SUPPURATIVA, [place] II Exam: interconnected draining scarred sinus tracts of left axilla  Chronic and persistent condition with duration or expected duration over one year. Condition is symptomatic/ bothersome to patient. Not currently at goal.   Hidradenitis Suppurativa is a chronic; persistent; non-curable, but treatable condition due to abnormal inflamed sweat glands in the body folds (axilla, inframammary, groin, medial thighs), causing recurrent painful draining cysts and scarring. It can be associated with severe scarring acne and cysts; also abscesses and scarring of scalp. The goal is control and prevention of flares, as it is not curable. Scars are permanent and can be thickened. Treatment may include daily use of topical medication and oral antibiotics.  Oral isotretinoin may also be helpful.  For some cases, Humira or Cosentyx (biologic injections) may be prescribed to decrease the inflammatory process and prevent flares.  When indicated, inflamed cysts may also be treated surgically.  Treatment Plan: Start Cosentyx 300 mg/32mL  Injected sample of Cosenytx 300 mg/19mL today to left upper arm. Patient tolerated well.  Lot # [id number]  Exp: 11/22/2024 Samples x 4 given to patient to continue loading dose at home. Patient will inject 300 mg/2 mL SQ Q wk for 4 weeks.  Lot # [id number]  Exp: 11/22/2024  EPIDERMAL INCLUSION CYST Exam: Subcutaneous nodule at left posterior neck with subQ induration on right posterior neck  Benign-appearing.  Exam most consistent with an epidermal inclusion cyst. Discussed that a cyst is a benign growth that can grow over time and sometimes get irritated or inflamed. Recommend observation if it is not bothersome. Discussed option of surgical excision to remove it if it is growing, symptomatic, or other changes noted. Please call for new or changing lesions so they can be evaluated.  Patient advised not to squeeze, will schedule for surgical excision.    ECZEMA, UNSPECIFIED TYPE   Related Medications Dupilumab (DUPIXENT) 300 MG/2ML SOAJ Inject 300 mg into the skin every 14 (fourteen) days. Starting at day 15 for maintenance. dupilumab (DUPIXENT) prefilled syringe 300 mg  ELIDEL 1 % cream APPLY TOPICALLY TWICE A DAY Crisaborole (EUCRISA) 2 % OINT Apply to upper lip 1-2 times daily HIDRADENITIS SUPPURATIVA   Related Medications Secukinumab, 300 MG Dose, (COSENTYX SENSOREADY, 300 MG,) 150 MG/ML SOAJ Inject 2 mLs (300 mg total) into the skin as directed. On week 0, 1, 2, 3 and 4. Secukinumab, 300 MG Dose, (COSENTYX SENSOREADY, 300 MG,) 150 MG/ML SOAJ Inject 2 mLs (300 mg total) into the skin every 28 (twenty-eight) days. For maintenance. Secukinumab SOAJ 300 mg  LONG-TERM USE OF HIGH-RISK MEDICATION   Related Medications Secukinumab, 300 MG Dose, (COSENTYX SENSOREADY, 300 MG,) 150 MG/ML SOAJ Inject 2 mLs (300 mg total) into the skin as directed. On week 0, 1, 2, 3 and 4. Secukinumab, 300 MG Dose, (COSENTYX SENSOREADY, 300 MG,) 150 MG/ML SOAJ Inject 2 mLs (300 mg total) into the skin every 28 (twenty-eight) days. For maintenance. Secukinumab SOAJ 300 mg  COUNSELING AND COORDINATION OF CARE    Return in about 5 weeks (around 06/29/2023) for Hidradenitis, Dupixent, next available for Cyst excision, with Dr. Katrinka Blazing.  [name], RMA, am acting as scribe for Elie Goody, MD .   Documentation: I have reviewed the above documentation for accuracy and completeness, and I agree  with the above.  Elie Goody, MD

## 2023-05-25 NOTE — Patient Instructions (Addendum)
Inject Cosentyx 300mg /50mL for 4 more weeks on Mondays. Samples were given.   Continue Dupixent injections every 2 weeks.  Start Eucrisa to upper lip 1-2 times daily. Samples given.  Reviewed risks of biologics including immunosuppression, infections, injection site reaction, and failure to improve condition. Goal is control of skin condition, not cure.  Some older biologics such as Humira and Enbrel may slightly increase risk of malignancy and may worsen congestive heart failure.  Taltz and Cosentyx may cause inflammatory bowel disease to flare. The use of biologics requires long term medication management, including periodic office visits and monitoring of blood work.  Dupilumab (Dupixent) is a treatment given by injection for adults and children with moderate-to-severe atopic dermatitis. Goal is control of skin condition, not cure. It is given as 2 injections at the first dose followed by 1 injection ever 2 weeks thereafter.  Young children are dosed monthly.  Potential side effects include allergic reaction, herpes infections, injection site reactions and conjunctivitis (inflammation of the eyes).  The use of Dupixent requires long term medication management, including periodic office visits.  Due to recent changes in healthcare laws, you may see results of your pathology and/or laboratory studies on MyChart before the doctors have had a chance to review them. We understand that in some cases there may be results that are confusing or concerning to you. Please understand that not all results are received at the same time and often the doctors may need to interpret multiple results in order to provide you with the best plan of care or course of treatment. Therefore, we ask that you please give [place] 2 business days to thoroughly review all your results before contacting the office for clarification. Should we see a critical lab result, you will be contacted sooner.   If You Need Anything After Your  Visit  If you have any questions or concerns for your doctor, please call our main line at [phone] and press option 4 to reach your doctor's medical assistant. If no one answers, please leave a voicemail as directed and we will return your call as soon as possible. Messages left after 4 pm will be answered the following business day.   You may also send [place] a message via MyChart. We typically respond to MyChart messages within 1-2 business days.  For prescription refills, please ask your pharmacy to contact our office. Our fax number is [phone].  If you have an urgent issue when the clinic is closed that cannot wait until the next business day, you can page your doctor at the number below.    Please note that while we do our best to be available for urgent issues outside of office hours, we are not available 24/7.   If you have an urgent issue and are unable to reach [place], you may choose to seek medical care at your doctor's office, retail clinic, urgent care center, or emergency room.  If you have a medical emergency, please immediately call 911 or go to the emergency department.  Pager Numbers  - Dr. Gwen Pounds: [phone]  - Dr. Roseanne Reno: [phone]  - Dr. Katrinka Blazing: [phone]   In the event of inclement weather, please call our main line at [phone] for an update on the status of any delays or closures.  Dermatology Medication Tips: Please keep the boxes that topical medications come in in order to help keep track of the instructions about where and how to use these. Pharmacies typically print the medication instructions only on the  boxes and not directly on the medication tubes.   If your medication is too expensive, please contact our office at [phone] option 4 or send [place] a message through MyChart.   We are unable to tell what your co-pay for medications will be in advance as this is different depending on your insurance coverage. However, we may be able to find a  substitute medication at lower cost or fill out paperwork to get insurance to cover a needed medication.   If a prior authorization is required to get your medication covered by your insurance company, please allow [place] 1-2 business days to complete this process.  Drug prices often vary depending on where the prescription is filled and some pharmacies may offer cheaper prices.  The website www.goodrx.com contains coupons for medications through different pharmacies. The prices here do not account for what the cost may be with help from insurance (it may be cheaper with your insurance), but the website can give you the price if you did not use any insurance.  - You can print the associated coupon and take it with your prescription to the pharmacy.  - You may also stop by our office during regular business hours and pick up a GoodRx coupon card.  - If you need your prescription sent electronically to a different pharmacy, notify our office through [facility] or by phone at [phone] option 4.     Si Usted Necesita Algo Despus de Su Visita  Tambin puede enviarnos un mensaje a travs de Clinical cytogeneticist. Por lo general respondemos a los mensajes de MyChart en el transcurso de 1 a 2 das hbiles.  Para renovar recetas, por favor pida a su farmacia que se ponga en contacto con nuestra oficina. Annie Sable de fax es [place] [phone].  Si tiene un asunto urgente cuando la clnica est cerrada y que no puede esperar hasta el siguiente da hbil, puede llamar/localizar a su doctor(a) al nmero que aparece a continuacin.   Por favor, tenga en cuenta que aunque hacemos todo lo posible para estar disponibles para asuntos urgentes fuera del horario de [place], no estamos disponibles las 24 horas del da, los 7 [address]  [address] de la [place].   Si tiene un problema urgente y no puede comunicarse con nosotros, puede optar por buscar atencin mdica  en el consultorio de su doctor(a), en una clnica privada, en un  centro de atencin urgente o en una sala de emergencias.  Si tiene Engineer, drilling, por favor llame inmediatamente al 911 o vaya a la sala de emergencias.  Nmeros de bper  - Dr. Gwen Pounds: [phone]  - Dra. Roseanne Reno: [phone]  - Dr. Katrinka Blazing: [phone]   En caso de inclemencias del tiempo, por favor llame a Lacy Duverney principal al [phone] para una actualizacin sobre el [place] de cualquier retraso o cierre.  Consejos para la medicacin en dermatologa: Por favor, guarde las cajas en las que vienen los medicamentos de uso tpico para ayudarle a seguir las instrucciones sobre dnde y cmo usarlos. Las farmacias generalmente imprimen las instrucciones del medicamento slo en las cajas y no directamente en los tubos del [place].   Si su medicamento es muy caro, por favor, pngase en contacto con Rolm [place]ala llamando al [phone] y presione la opcin 4 o envenos un mensaje a travs de Clinical cytogeneticist.   No podemos decirle cul ser su copago por los medicamentos por adelantado ya que esto es diferente dependiendo de la cobertura de su seguro. Sin embargo, es posible que  podamos encontrar un medicamento sustituto a Audiological scientist un formulario para que el seguro cubra el medicamento que se considera necesario.   Si se requiere una autorizacin previa para que su compaa de seguros [place] su medicamento, por favor permtanos de 1 a 2 das hbiles para completar [address]  Los precios de los medicamentos varan con frecuencia dependiendo del Environmental consultant de dnde se surte la receta y alguna farmacias pueden ofrecer precios ms baratos.  El sitio web www.goodrx.com tiene cupones para medicamentos de Health and safety inspector. Los precios aqu no tienen en cuenta lo que podra costar con la ayuda del seguro (puede ser ms barato con su seguro), pero el sitio web puede darle el precio si no utiliz Tourist information centre manager.  - Puede imprimir el cupn correspondiente y llevarlo con su  receta a la farmacia.  - Tambin puede pasar por nuestra oficina durante el horario de atencin regular y Education officer, museum una tarjeta de cupones de GoodRx.  - Si necesita que su receta se enve electrnicamente a una farmacia diferente, informe a nuestra oficina a travs de MyChart de [place] o por telfono llamando al [phone] y presione la opcin 4.

## 2023-05-29 ENCOUNTER — Encounter: Payer: Self-pay | Admitting: Dermatology

## 2023-06-08 ENCOUNTER — Other Ambulatory Visit: Payer: Self-pay

## 2023-06-08 DIAGNOSIS — L732 Hidradenitis suppurativa: Secondary | ICD-10-CM

## 2023-06-08 DIAGNOSIS — Z79899 Other long term (current) drug therapy: Secondary | ICD-10-CM

## 2023-06-08 MED ORDER — COSENTYX SENSOREADY (300 MG) 150 MG/ML ~~LOC~~ SOAJ: 300.0000 mg | SUBCUTANEOUS | 5 refills | Status: DC

## 2023-06-08 MED ORDER — COSENTYX SENSOREADY (300 MG) 150 MG/ML ~~LOC~~ SOAJ: 300.0000 mg | SUBCUTANEOUS | 0 refills | Status: DC

## 2023-06-08 NOTE — Progress Notes (Signed)
 Fax received from [facility] requesting prescription be sent to them for Cosentyx so patient can receive medication.

## 2023-06-11 ENCOUNTER — Encounter: Payer: Self-pay | Admitting: Dermatology

## 2023-06-24 ENCOUNTER — Other Ambulatory Visit: Payer: Self-pay

## 2023-06-24 DIAGNOSIS — Z79899 Other long term (current) drug therapy: Secondary | ICD-10-CM

## 2023-06-24 DIAGNOSIS — L732 Hidradenitis suppurativa: Secondary | ICD-10-CM

## 2023-06-24 MED ORDER — COSENTYX SENSOREADY (300 MG) 150 MG/ML ~~LOC~~ SOAJ: 300.0000 mg | SUBCUTANEOUS | 0 refills | Status: DC

## 2023-06-24 MED ORDER — COSENTYX SENSOREADY (300 MG) 150 MG/ML ~~LOC~~ SOAJ: 300.0000 mg | SUBCUTANEOUS | 5 refills | Status: DC

## 2023-06-24 NOTE — Progress Notes (Signed)
Transfer of pharmacy per insurance benefits. aw

## 2023-06-28 ENCOUNTER — Encounter: Payer: Self-pay | Admitting: Dermatology

## 2023-06-29 ENCOUNTER — Ambulatory Visit: Payer: Medicaid Other | Admitting: Dermatology

## 2023-07-01 ENCOUNTER — Other Ambulatory Visit (HOSPITAL_BASED_OUTPATIENT_CLINIC_OR_DEPARTMENT_OTHER): Payer: Self-pay | Admitting: Family Medicine

## 2023-07-01 MED ORDER — CETIRIZINE HCL 10 MG PO TABS: 10.0000 mg | ORAL_TABLET | Freq: Every day | ORAL | 1 refills | Status: DC

## 2023-07-01 NOTE — Telephone Encounter (Signed)
 Copied from CRM [id number]. Topic: Clinical - Medication Refill >> Jul 01, 2023 11:45 AM Carmell SAUNDERS wrote: Most Recent Primary Care Visit:  Provider: TOWANA SMALL  [facility]  Visit Type: FOLLOW UP  Date: 05/04/2023  Medication: cetirizine  (ZYRTEC ) 10 MG tablet  Has the patient contacted their pharmacy? Yes. Pharmacy sent request but they were unable to get a response.  Is this the correct pharmacy for this prescription? Yes If no, delete pharmacy and type the correct one.  This is the patient's preferred pharmacy:  [id number] [place], [place] - [address] [place] [address] Phone: [phone] Fax: [phone]  Has the prescription been filled recently? No  Is the patient out of the medication? No  Has the patient been seen for an appointment in the last year OR does the patient have an upcoming appointment? Yes  Can we respond through MyChart? Yes  Agent: Please be advised that Rx refills may take up to 3 business days. We ask that you follow-up with your pharmacy.  Note: Dr. Towana is no longer at this office and upcoming appt is with Dr. Knute.

## 2023-07-13 ENCOUNTER — Other Ambulatory Visit (HOSPITAL_BASED_OUTPATIENT_CLINIC_OR_DEPARTMENT_OTHER): Payer: Self-pay | Admitting: Family Medicine

## 2023-07-13 DIAGNOSIS — Z3009 Encounter for other general counseling and advice on contraception: Secondary | ICD-10-CM

## 2023-07-13 NOTE — Telephone Encounter (Signed)
 Copied from CRM [id number]. Topic: Clinical - Medication Refill >> Jul 13, 2023 11:08 AM Hector Shade B wrote: Most Recent Primary Care Visit:  Provider: Alyson Reedy  Department: [facility]  Visit Type: FOLLOW UP  Date: 05/04/2023  Medication: JUNEL FE 1.5/30 1.5-30 MG-MCG tablet  Has the patient contacted their pharmacy? Yes (Agent: If no, request that the patient contact the pharmacy for the refill. If patient does not wish to contact the pharmacy document the reason why and proceed with request.) (Agent: If yes, when and what did the pharmacy advise?) Patient stated she called the pharmacy they advised her that the refill was denied, was sent a message also stating that she was denied.   Is this the correct pharmacy for this prescription? Yes If no, delete pharmacy and type the correct one.  This is the patient's preferred pharmacy:  [id number] [place], [place] - [address] [place] [address] Phone: [phone] Fax: [phone]  [id number] [place], [place] - [address] AT [facility] [address] [place] [address] Phone: [phone] Fax: [phone]  [facility] - [place], [place] - [address] [place] [address] Phone: [phone] Fax: [phone]  [facility] - [place], [place] - [address] [place] [address] Phone: [phone] Fax: [phone]   Has the prescription been filled recently? Yes 05/2023  Is the patient out of the medication? Yes  Has the patient been seen for an appointment in the last year OR does the patient have an upcoming appointment? Yes  Can we respond through MyChart? Yes  Agent: Please be advised that Rx refills may take up to 3 business days. We ask that you follow-up with your pharmacy.

## 2023-07-21 ENCOUNTER — Telehealth (HOSPITAL_BASED_OUTPATIENT_CLINIC_OR_DEPARTMENT_OTHER): Payer: Self-pay | Admitting: *Deleted

## 2023-07-21 NOTE — Telephone Encounter (Signed)
 Copied from CRM [id number]. Topic: Appointments - Transfer of Care >> Jul 21, 2023  3:23 PM Higinio Roger wrote: Pt is requesting to transfer FROM: FNP Alyson Reedy Pt is requesting to transfer TO: NP Gwinda Passe Reason for requested transfer: Provider relocated It is the responsibility of the team the patient would like to transfer to (NP Gwinda Passe) to reach out to the patient if for any reason this transfer is not acceptable.

## 2023-07-21 NOTE — Telephone Encounter (Signed)
 noted

## 2023-07-22 ENCOUNTER — Ambulatory Visit (INDEPENDENT_AMBULATORY_CARE_PROVIDER_SITE_OTHER): Payer: Medicaid Other | Admitting: Dermatology

## 2023-07-22 ENCOUNTER — Encounter: Payer: Self-pay | Admitting: Dermatology

## 2023-07-22 DIAGNOSIS — R221 Localized swelling, mass and lump, neck: Secondary | ICD-10-CM

## 2023-07-22 DIAGNOSIS — R229 Localized swelling, mass and lump, unspecified: Secondary | ICD-10-CM

## 2023-07-22 NOTE — Patient Instructions (Signed)

## 2023-07-22 NOTE — Progress Notes (Signed)
   Follow-Up Visit   Subjective  Madison Watson is a 18 y.o. female who presents for the following: Excision of cyst at right posterior neck  The following portions of the chart were reviewed this encounter and updated as appropriate: medications, allergies, medical history  Review of Systems:  No other skin or systemic complaints except as noted in HPI or Assessment and Plan.  Objective  Well appearing patient in no apparent distress; mood and affect are within normal limits.  A focused examination was performed of the following areas: neck Relevant physical exam findings are noted in the Assessment and Plan.     Assessment & Plan   SUBCUTANEOUS NODULE  Planned to excise posterior neck subcutaneous nodule. Prepped with alcohol, trimmer hair, injected 6 ml lidocaine 4 ml bupivicaine. Swelling from anesthetic caused lesion to sink further and was no longer appreciable. Decided it was too deep to excise in clinic. Sending ultrasound for further characterization. If it is a lipoma, it may penetrate into deep tissues and require general anesthesia and excision with general surgery. Patient and mother agree with plan.  Separately gave sample of Opzelura for lip LSC. Apply daily x 2 weeks then send update. Related Procedures US Soft Tissue Head/Neck (NON-THYROID)   Return in about 1 week (around 07/29/2023) for Suture Removal, with Dr. Katrinka Watson.  Madison Watson, RMA, am acting as scribe for Madison Goody, MD .   Documentation: I have reviewed the above documentation for accuracy and completeness, and I agree with the above.  Madison Goody, MD

## 2023-07-31 ENCOUNTER — Other Ambulatory Visit (HOSPITAL_COMMUNITY): Payer: Medicaid Other

## 2023-08-03 ENCOUNTER — Ambulatory Visit (HOSPITAL_BASED_OUTPATIENT_CLINIC_OR_DEPARTMENT_OTHER): Payer: Medicaid Other | Admitting: Family Medicine

## 2023-08-03 ENCOUNTER — Ambulatory Visit (INDEPENDENT_AMBULATORY_CARE_PROVIDER_SITE_OTHER): Payer: Medicaid Other | Admitting: Primary Care

## 2023-08-06 DIAGNOSIS — K130 Diseases of lips: Secondary | ICD-10-CM | POA: Insufficient documentation

## 2023-08-06 DIAGNOSIS — L2084 Intrinsic (allergic) eczema: Secondary | ICD-10-CM | POA: Diagnosis not present

## 2023-08-06 DIAGNOSIS — L732 Hidradenitis suppurativa: Secondary | ICD-10-CM | POA: Diagnosis not present

## 2023-08-06 DIAGNOSIS — L219 Seborrheic dermatitis, unspecified: Secondary | ICD-10-CM | POA: Diagnosis not present

## 2023-08-06 DIAGNOSIS — L209 Atopic dermatitis, unspecified: Secondary | ICD-10-CM

## 2023-08-06 HISTORY — DX: Atopic dermatitis, unspecified: L20.9

## 2023-08-07 ENCOUNTER — Ambulatory Visit (HOSPITAL_COMMUNITY)
Admission: RE | Admit: 2023-08-07 | Discharge: 2023-08-07 | Disposition: A | Discharge status: Home / Self Care | Payer: Medicaid Other | Source: Ambulatory Visit | Attending: Dermatology | Admitting: Dermatology

## 2023-08-07 DIAGNOSIS — R221 Localized swelling, mass and lump, neck: Secondary | ICD-10-CM | POA: Diagnosis not present

## 2023-08-07 DIAGNOSIS — R229 Localized swelling, mass and lump, unspecified: Secondary | ICD-10-CM | POA: Diagnosis not present

## 2023-08-24 ENCOUNTER — Ambulatory Visit (INDEPENDENT_AMBULATORY_CARE_PROVIDER_SITE_OTHER): Admitting: Dermatology

## 2023-08-24 ENCOUNTER — Encounter: Payer: Self-pay | Admitting: Dermatology

## 2023-08-24 DIAGNOSIS — L209 Atopic dermatitis, unspecified: Secondary | ICD-10-CM | POA: Insufficient documentation

## 2023-08-24 DIAGNOSIS — Z7189 Other specified counseling: Secondary | ICD-10-CM

## 2023-08-24 DIAGNOSIS — Z79899 Other long term (current) drug therapy: Secondary | ICD-10-CM

## 2023-08-24 DIAGNOSIS — L810 Postinflammatory hyperpigmentation: Secondary | ICD-10-CM | POA: Diagnosis not present

## 2023-08-24 DIAGNOSIS — L280 Lichen simplex chronicus: Secondary | ICD-10-CM | POA: Insufficient documentation

## 2023-08-24 DIAGNOSIS — L24A1 Irritant contact dermatitis due to saliva: Secondary | ICD-10-CM | POA: Diagnosis not present

## 2023-08-24 DIAGNOSIS — L732 Hidradenitis suppurativa: Secondary | ICD-10-CM

## 2023-08-24 NOTE — Progress Notes (Signed)
 Follow-Up Visit   Subjective  Madison Watson is a 18 y.o. female who presents for the following: HS and atopic derm follow up. Patient currently on Cosentyx and Dupixent, feeling good with some improvement. Patient did have 2nd opinion at [facility] and was given Desonide to use at face and told to use OTC soap for HS but patient can not remember name. She has also used Opzelura at upper lip.   Patient did have ultrasound of neck but has not gotten results yet.   The patient has spots, moles and lesions to be evaluated, some may be new or changing and the patient may have concern these could be cancer.   The following portions of the chart were reviewed this encounter and updated as appropriate: medications, allergies, medical history  Review of Systems:  No other skin or systemic complaints except as noted in HPI or Assessment and Plan.  Objective  Well appearing patient in no apparent distress; mood and affect are within normal limits.   A focused examination was performed of the following areas: face  Relevant exam findings are noted in the Assessment and Plan.    Assessment & Plan   ATOPIC DERMATITIS, improving on dupixent Exam: Clearance on AC fossae with mild PIH <1%BSA   Chronic and persistent condition with duration or expected duration over one year. Condition is symptomatic/ bothersome to patient. Not currently at goal.     Atopic dermatitis (eczema) is a chronic, relapsing, pruritic condition that can significantly affect quality of life. It is often associated with allergic rhinitis and/or asthma and can require treatment with topical medications, phototherapy, or in severe cases biologic injectable medication (Dupixent; Adbry) or Oral JAK inhibitors.   Treatment Plan: Continue Dupixent 300 mg/25mL SQ Q2 wks.   Recommend gentle skin care.   Dupilumab (Dupixent) is a treatment given by injection for adults and children with moderate-to-severe atopic  dermatitis. Goal is control of skin condition, not cure. It is given as 2 injections at the first dose followed by 1 injection ever 2 weeks thereafter.  Young children are dosed monthly.   Potential side effects include allergic reaction, herpes infections, injection site reactions and conjunctivitis (inflammation of the eyes).  The use of Dupixent requires long term medication management, including periodic office visits.  Irritant contact dermatitis due to saliva on upper cutaneous lip that has led to [facility] Exam: Hyperpigmented lichenified plaque of upper cutaneous lip slightly improved.   Plan: Continue Opzelura daily to upper lip. Risk of acne periorificial dermatitis atrophy with topical steroid use on face  HIDRADENITIS SUPPURATIVA, [place] II, improving on cosentyx Exam: patient defers exam, reports improvement   Chronic and persistent condition with duration or expected duration over one year. Condition is symptomatic/ bothersome to patient. Not currently at goal.     Hidradenitis Suppurativa is a chronic; persistent; non-curable, but treatable condition due to abnormal inflamed sweat glands in the body folds (axilla, inframammary, groin, medial thighs), causing recurrent painful draining cysts and scarring. It can be associated with severe scarring acne and cysts; also abscesses and scarring of scalp. The goal is control and prevention of flares, as it is not curable. Scars are permanent and can be thickened. Treatment may include daily use of topical medication and oral antibiotics.  Oral isotretinoin may also be helpful.  For some cases, Humira or Cosentyx (biologic injections) may be prescribed to decrease the inflammatory process and prevent flares.  When indicated, inflamed cysts may also be treated surgically.  Treatment Plan: Continue Cosentyx 300 mg/12mL SQ Q4 wks  Reviewed risks of biologics including immunosuppression, infections, injection site reaction, and failure to improve  condition. Goal is control of skin condition, not cure.  Some older biologics such as Humira and Enbrel may slightly increase risk of malignancy and may worsen congestive heart failure.  Taltz and Cosentyx may cause inflammatory bowel disease to flare. The use of biologics requires long term medication management, including periodic office visits and monitoring of blood work.  Neck ultrasound report not yet available ATOPIC DERMATITIS, UNSPECIFIED TYPE   HIDRADENITIS SUPPURATIVA   Related Medications Secukinumab, 300 MG Dose, (COSENTYX SENSOREADY, 300 MG,) 150 MG/ML SOAJ Inject 2 mLs (300 mg total) into the skin every 28 (twenty-eight) days. For maintenance. Secukinumab, 300 MG Dose, (COSENTYX SENSOREADY, 300 MG,) 150 MG/ML SOAJ Inject 2 mLs (300 mg total) into the skin as directed. On week 0, 1, 2, 3 and 4. LONG-TERM USE OF HIGH-RISK MEDICATION   Related Medications Secukinumab, 300 MG Dose, (COSENTYX SENSOREADY, 300 MG,) 150 MG/ML SOAJ Inject 2 mLs (300 mg total) into the skin every 28 (twenty-eight) days. For maintenance. Secukinumab, 300 MG Dose, (COSENTYX SENSOREADY, 300 MG,) 150 MG/ML SOAJ Inject 2 mLs (300 mg total) into the skin as directed. On week 0, 1, 2, 3 and 4. COUNSELING AND COORDINATION OF CARE    Return in about 6 months (around 02/23/2024) for Hidradenitis, AD, with Dr. Katrinka Blazing.  [name], RMA, am acting as scribe for Elie Goody, MD .   Documentation: I have reviewed the above documentation for accuracy and completeness, and I agree with the above.  Elie Goody, MD

## 2023-08-24 NOTE — Patient Instructions (Signed)

## 2023-08-28 ENCOUNTER — Telehealth: Payer: Self-pay | Admitting: Dermatology

## 2023-08-28 NOTE — Telephone Encounter (Signed)
 Discussed ultrasound result with patient. It shows "Complex solid cystic mass of the posterior neck requires further evaluation with contrast enhanced CT. The exact etiology is not certain from ultrasound alone." It also stated some of lesion was subQ but most is deeper, so aborting surgery in dermatology clinic was the appropriate decision. Given this result, I recommend referral to ENT for consultation and possible imaging. I would not want to order the wrong CT and waste time/money/radiation exposure. Patient agrees

## 2023-08-31 ENCOUNTER — Other Ambulatory Visit: Payer: Self-pay

## 2023-08-31 DIAGNOSIS — R229 Localized swelling, mass and lump, unspecified: Secondary | ICD-10-CM

## 2023-09-04 ENCOUNTER — Encounter: Payer: Self-pay | Admitting: Family

## 2023-09-04 ENCOUNTER — Ambulatory Visit: Payer: Medicaid Other | Admitting: Family

## 2023-09-04 VITALS — BP 114/73 | HR 69 | Temp 98.2°F | Ht 66.0 in | Wt 255.0 lb

## 2023-09-04 DIAGNOSIS — Z3009 Encounter for other general counseling and advice on contraception: Secondary | ICD-10-CM

## 2023-09-04 DIAGNOSIS — L732 Hidradenitis suppurativa: Secondary | ICD-10-CM

## 2023-09-04 DIAGNOSIS — L219 Seborrheic dermatitis, unspecified: Secondary | ICD-10-CM | POA: Diagnosis not present

## 2023-09-04 DIAGNOSIS — E669 Obesity, unspecified: Secondary | ICD-10-CM | POA: Insufficient documentation

## 2023-09-04 MED ORDER — JUNEL FE 1.5/30 1.5-30 MG-MCG PO TABS: 1.0000 | ORAL_TABLET | Freq: Every day | ORAL | 11 refills | Status: DC

## 2023-09-04 NOTE — Patient Instructions (Addendum)
 Welcome to [facility], It was a pleasure meeting you today!  As discussed, I have sent your birth control  to your pharmacy.  Please schedule a physical with fasting labs and PAP smear for later in the year at your convenience.  Start working on what we discussed today regarding your weight loss goals:  1) Eat small portions, ok to eat 6 mini meals vs. 3 big meals if this controls your hunger  better. 2) Eat until full and then STOP! This is your body telling you it has had enough. 3) Drink at least 64 oz water = 2 liters = 8, 8oz cups DAILY. 4) Eat most of your calories earlier in the day (if you work during the day).  Want to eat within a 8-10hour window if possible. No eating after 7pm, only no calorie drinks or water from 7p until bedtime. 5) Look for low carb, high protein foods/recipes. Avoid simple carbs including sweets, white bread, rice, potatoes, pasta. Look for whole grain/whole wheat/or almond flour if eating these foods. 6) High protein foods: meats (limit red meat), including [place], chicken, pork, FISH (best source) nuts, cheese, beans (black beans, soy/edamame beans are best). Protein drinks, bars are also good but must have low sugar, look for < 10 grams. 7) Avoid processed/refined sugar and artificial sweeteners if possible. Stevia, Erythritol, or Monk fruit sweetener are preferred, natural, no calorie sweeteners.  Natural sweeteners like Agave or honey in very small amounts are ok also.    PLEASE NOTE: If you had any LAB tests please let us know if you have not heard back within a few days. You may see your results on MyChart before we have a chance to review them but we will give you a call once they are reviewed by [place]. If we ordered any REFERRALS today, please let us know if you have not heard from their office within the next week.  Let us know through MyChart if you are needing REFILLS, or have your pharmacy send [place] the request. You can also  use MyChart to communicate with me or any office staff.

## 2023-09-04 NOTE — Assessment & Plan Note (Signed)
 Advised on Medical weight loss clinic, pt to consider. - Encourage dietary modifications: reduce sugar intake, focus on protein and vegetables, limit processed foods. - Encourage regular physical activity. - Consider referral to a healthy weight and wellness clinic for further support.

## 2023-09-04 NOTE — Assessment & Plan Note (Signed)
 Taking Junel for birth control, desires regular menstrual cycles. - Send prescription for [facility] to [facility] with enough refills for the year.

## 2023-09-04 NOTE — Progress Notes (Signed)
 New Patient Office Visit  Subjective:  Patient ID: Madison Watson, female    DOB: 04-08-2006  Age: 18 y.o. MRN: [id number]  CC:  Chief Complaint  Patient presents with   New Patient (Initial Visit)   HPI Madison Watson presents for establishing care today.  Discussed the use of AI scribe software for clinical note transcription with the patient, who gave verbal consent to proceed.  History of Present Illness Madison Watson, a young adult studying early childhood education, presents to establish care and with ongoing skin issues, including eczema and hidradenitis. She reports that her eczema affects her scalp, causing severe dandruff that can lead to bleeding. She notes that her symptoms seem to worsen with stress, leading to larger flakes of dandruff. She is currently on Dupixent for her scalp and skin issues. In addition to eczema, Madison Watson also suffers from hidradenitis, which primarily affects one of her arms. She has experienced large boils that have spontaneously ruptured in the past. She is currently on Cosentyx for this condition, which she started in February of this year. Since starting the medication, she reports that the hidradenitis has not been appearing. Madison Watson also mentions needing a refill for her birth control pills, Junel. She has not had a Pap smear done yet. Lastly, Madison Watson expresses concern about her weight. She admits to struggling with maintaining a healthy diet, particularly when stressed, and expresses a desire to work on her weight.  Assessment & Plan Eczema and Scalp Dermatitis - Chronic eczema and scalp dermatitis with severe dandruff, improved with Dupixent. - Continue to follow with Dermatology & weekly Dupixent injections.  Hidradenitis Suppurativa - Chronic hidradenitis suppurativa in left axilla, improved with Cosentyx. Weight management discussed. - Continue to follow with dermatology and Cosentyx as prescribed. - Encourage weight management through diet and  exercise.  Contraceptive Management - Taking Junel for birth control, desires regular menstrual cycles. - [facility] with [facility] refills for the [facility].  General Health Maintenance/Obesity - No Pap smear history. Up to date on vaccinations. Discussed getting Pap smear at her physical and weight management. - Schedule Pap smear with physical later in the year. - Encourage dietary modifications: reduce sugar intake, focus on protein and vegetables, limit processed foods. - Encourage regular physical activity. - Consider referral to a healthy weight and wellness clinic for further support.  Subjective:    Outpatient Medications Prior to Visit  Medication Sig Dispense Refill   cetirizine (ZYRTEC) 10 MG tablet Take 1 tablet (10 mg total) by mouth daily. 30 tablet 1   desonide (DESOWEN) 0.05 % ointment Apply 1 Application topically 2 (two) times daily. 60 g 2   Dupilumab (DUPIXENT) 300 MG/2ML SOAJ Inject 300 mg into the skin every 14 (fourteen) days. Starting at day 15 for maintenance. 4 mL 5   Fluocinolone Acetonide Body (DERMA-SMOOTHE/FS BODY) 0.01 % OIL Apply 1 Application topically daily as needed (Daily as needed for dandruff flars). 118.28 mL 1   FLUoxetine (PROZAC) 10 MG capsule Take 1 capsule (10 mg total) by mouth daily. 90 capsule 3   Secukinumab, 300 MG Dose, (COSENTYX SENSOREADY, 300 MG,) 150 MG/ML SOAJ Inject 2 mLs (300 mg total) into the skin every 28 (twenty-eight) days. For maintenance. 2 mL 5   Secukinumab, 300 MG Dose, (COSENTYX SENSOREADY, 300 MG,) 150 MG/ML SOAJ Inject 2 mLs (300 mg total) into the skin as directed. On week 0, 1, 2, 3 and 4. 10 mL 0   triamcinolone cream (KENALOG) 0.5 %  Apply 1 Application topically 2 (two) times daily. To affected areas. 45 g 3   JUNEL FE 1.5/30 1.5-30 MG-MCG tablet Take 1 tablet by mouth daily.     Crisaborole (EUCRISA) 2 % OINT Apply to upper lip 1-2 times daily (Patient not taking: Reported on 09/04/2023) 60 g 2    ELIDEL 1 % cream APPLY TOPICALLY TWICE A DAY (Patient not taking: Reported on 09/04/2023) 30 g 0   hydroquinone 4 % cream APPLY TOPICALLY AT BEDTIME. APPLY TOPICALLY AT BEDTIME TO AFFECTED AREA FOR 4 WEEKS. (Patient not taking: Reported on 09/04/2023) 28.35 g 0   Facility-Administered Medications Prior to Visit  Medication Dose Route Frequency Provider Last Rate Last Admin   dupilumab (DUPIXENT) prefilled syringe 300 mg  300 mg Subcutaneous Once Elie Goody, MD       Past Medical History:  Diagnosis Date   Allergic rhinitis 01/18/2014   Birth control counseling 11/13/2022   Cheilitis due to atopic dermatitis 08/06/2023   Childhood asthma    Class 2 severe obesity due to excess calories with serious comorbidity and body mass index (BMI) of 36.0 to 36.9 in adult [facility]) 11/13/2022   Screen for sexually transmitted diseases 03/06/2023   History reviewed. No pertinent surgical history.  Objective:   Today's Vitals: BP 114/73 (BP Location: Left Arm, Patient Position: Sitting, Cuff Size: Large)   Pulse 69   Temp 98.2 F (36.8 C) (Temporal)   Ht 5\' 6"  (1.676 m)   Wt 255 lb (115.7 kg)   LMP 08/13/2023 (Exact Date)   SpO2 98%   BMI 41.16 kg/m   Physical Exam Vitals and nursing note reviewed.  Constitutional:      Appearance: Normal appearance. She is morbidly obese.  Cardiovascular:     Rate and Rhythm: Normal rate and regular rhythm.  Pulmonary:     Effort: Pulmonary effort is normal.     Breath sounds: Normal breath sounds.  Musculoskeletal:        General: Normal range of motion.  Skin:    General: Skin is warm and dry.  Neurological:     Mental Status: She is alert.  Psychiatric:        Mood and Affect: Mood normal.        Behavior: Behavior normal.    Meds ordered this encounter  Medications   JUNEL FE 1.5/30 1.5-30 MG-MCG tablet    Sig: Take 1 tablet by mouth daily.    Dispense:  28 tablet    Refill:  11    Supervising Provider:   ANDY, CAMILLE L [[id number]]    Dulce Sellar, NP

## 2023-09-04 NOTE — Assessment & Plan Note (Signed)
 Chronic hidradenitis suppurativa in left axilla, improved with Cosentyx. Weight management discussed. - Continue to follow with dermatology and Cosentyx as prescribed. - Encourage weight management through diet and exercise.

## 2023-09-04 NOTE — Assessment & Plan Note (Signed)
 Chronic eczema and scalp dermatitis with severe dandruff, improved with Dupixent. - Continue to follow with Dermatology & weekly Dupixent injections.

## 2023-09-08 ENCOUNTER — Ambulatory Visit: Admitting: Physician Assistant

## 2023-09-08 ENCOUNTER — Encounter: Payer: Self-pay | Admitting: Physician Assistant

## 2023-09-08 VITALS — BP 122/84 | HR 62 | Temp 97.9°F | Ht 66.0 in | Wt 254.5 lb

## 2023-09-08 DIAGNOSIS — R0981 Nasal congestion: Secondary | ICD-10-CM | POA: Diagnosis not present

## 2023-09-08 LAB — POC COVID19 BINAXNOW: SARS Coronavirus 2 Ag: NEGATIVE

## 2023-09-08 MED ORDER — AMOXICILLIN-POT CLAVULANATE 875-125 MG PO TABS: 1.0000 | ORAL_TABLET | Freq: Two times a day (BID) | ORAL | 0 refills | Status: DC

## 2023-09-08 NOTE — Progress Notes (Signed)
 Madison Watson is a 18 y.o. female here for a new problem.  History of Present Illness:   Chief Complaint  Patient presents with   Sinus Problem    Pt c/o sinus issues x 3 days, nasal congestion light yellow drainage, ears feel clogged. No fever or chills.   Sinus issue: Pt complains of nasal congestion, ear fullness, sore throat, and mild cough, starting 3 days ago.  Her ear pressure and fullness initially began in her left ear before extending to her right ear.  Pt states her ears often feel clogged, managed by Cetirizine.  She has taken Cetirizine, "sinus pills", nasal spray (2 sprays per nostril), and sudafed for her current symptoms.  She took 2 tabs of Sudafed last night and 2 tabs this morning.  Overall her symptoms have improved with this regimen.  She reports going to a baseball game recently and being exposed to excess pollen; believes this may have contributed to her symptoms.  No known recent exposure to any respiratory illnesses.  Denies any fever or chills.   Past Medical History:  Diagnosis Date   Allergic rhinitis 01/18/2014   Birth control counseling 11/13/2022   Cheilitis due to atopic dermatitis 08/06/2023   Childhood asthma    Class 2 severe obesity due to excess calories with serious comorbidity and body mass index (BMI) of 36.0 to 36.9 in adult [facility]) 11/13/2022   Dandruff 01/30/2023   Screen for sexually transmitted diseases 03/06/2023     Social History   Tobacco Use   Smoking status: Never   Smokeless tobacco: Never  Substance Use Topics   Alcohol use: Never   Drug use: Never    No past surgical history on file.  Family History  Problem Relation Age of Onset   Asthma Mother    Hypertension Father    Asthma Sister    Diabetes Maternal Grandmother    Asthma Other    Hypertension Other     No Known Allergies  Current Medications:   Current Outpatient Medications:    amoxicillin-clavulanate (AUGMENTIN) 875-125 MG tablet, Take 1 tablet by  mouth 2 (two) times daily., Disp: 14 tablet, Rfl: 0   cetirizine (ZYRTEC) 10 MG tablet, Take 1 tablet (10 mg total) by mouth daily., Disp: 30 tablet, Rfl: 1   desonide (DESOWEN) 0.05 % ointment, Apply 1 Application topically 2 (two) times daily., Disp: 60 g, Rfl: 2   Dupilumab (DUPIXENT) 300 MG/2ML SOAJ, Inject 300 mg into the skin every 14 (fourteen) days. Starting at day 15 for maintenance., Disp: 4 mL, Rfl: 5   Fluocinolone Acetonide Body (DERMA-SMOOTHE/FS BODY) 0.01 % OIL, Apply 1 Application topically daily as needed (Daily as needed for dandruff flars)., Disp: 118.28 mL, Rfl: 1   FLUoxetine (PROZAC) 10 MG capsule, Take 1 capsule (10 mg total) by mouth daily., Disp: 90 capsule, Rfl: 3   JUNEL FE 1.5/30 1.5-30 MG-MCG tablet, Take 1 tablet by mouth daily., Disp: 28 tablet, Rfl: 11   Secukinumab, 300 MG Dose, (COSENTYX SENSOREADY, 300 MG,) 150 MG/ML SOAJ, Inject 2 mLs (300 mg total) into the skin every 28 (twenty-eight) days. For maintenance., Disp: 2 mL, Rfl: 5   Secukinumab, 300 MG Dose, (COSENTYX SENSOREADY, 300 MG,) 150 MG/ML SOAJ, Inject 2 mLs (300 mg total) into the skin as directed. On week 0, 1, 2, 3 and 4., Disp: 10 mL, Rfl: 0   triamcinolone cream (KENALOG) 0.5 %, Apply 1 Application topically 2 (two) times daily. To affected areas., Disp: 45 g, Rfl:  3  Current Facility-Administered Medications:    dupilumab (DUPIXENT) prefilled syringe 300 mg, 300 mg, Subcutaneous, Once, Harris Liming, MD   Review of Systems:   Negative unless otherwise specified per HPI.  Vitals:   Vitals:   09/08/23 1135  BP: 122/84  Pulse: 62  Temp: 97.9 F (36.6 C)  TempSrc: Temporal  SpO2: 97%  Weight: 254 lb 8 oz (115.4 kg)  Height: 5\' 6"  (1.676 m)     Body mass index is 41.08 kg/m.  Physical Exam:   Physical Exam Vitals and nursing note reviewed.  Constitutional:      General: She is not in acute distress.    Appearance: She is well-developed. She is not ill-appearing or  toxic-appearing.  HENT:     Head: Normocephalic and atraumatic.     Right Ear: Ear canal and external ear normal. A middle ear effusion is present. Tympanic membrane is not erythematous, retracted or bulging.     Left Ear: Ear canal and external ear normal. A middle ear effusion is present. Tympanic membrane is not erythematous, retracted or bulging.     Nose: Nose normal.     Right Sinus: No maxillary sinus tenderness or frontal sinus tenderness.     Left Sinus: No maxillary sinus tenderness or frontal sinus tenderness.     Mouth/Throat:     Pharynx: Uvula midline. No posterior oropharyngeal erythema.  Eyes:     General: Lids are normal.     Conjunctiva/sclera: Conjunctivae normal.  Neck:     Trachea: Trachea normal.  Cardiovascular:     Rate and Rhythm: Normal rate and regular rhythm.     Heart sounds: Normal heart sounds, S1 normal and S2 normal.  Pulmonary:     Effort: Pulmonary effort is normal.     Breath sounds: Normal breath sounds. No decreased breath sounds, wheezing, rhonchi or rales.  Lymphadenopathy:     Cervical: No cervical adenopathy.  Skin:    General: Skin is warm and dry.  Neurological:     Mental Status: She is alert.  Psychiatric:        Speech: Speech normal.        Behavior: Behavior normal. Behavior is cooperative.    Results for orders placed or performed in visit on 09/08/23  POC COVID-19  Result Value Ref Range   SARS Coronavirus 2 Ag Negative Negative     Assessment and Plan:   1. Nasal congestion (Primary) - POC COVID-19 No red flags on discussion, patient is not in any obvious distress during our visit. Discussed progression of most viral illness, and recommended supportive care at this point in time. Suspect eustachian tube dysfunction -- continue nasal sprays and antihistamine I did however provide pocket rx for oral augmentin should symptoms not improve as anticipated. Discussed over the counter supportive care options, with  recommendations to push fluids and rest. Reviewed return precautions including new/worsening fever, SOB, new/worsening cough or other concerns.  Recommended need to self-quarantine and practice social distancing until symptoms resolve. Discussed current recommendations for COVID testing. I recommend that patient follow-up if symptoms worsen or persist despite treatment x 7-10 days, sooner if needed.    I, Bernita Bristle, acting as a Neurosurgeon for Alexander Iba, [place]., have documented all relevant documentation on the behalf of Alexander Iba, [place], as directed by  Alexander Iba, PA while in the presence of Alexander Iba, [place].  I, Alexander Iba, [place], have reviewed all documentation for this visit. The documentation on 09/08/23 for the exam,  diagnosis, procedures, and orders are all accurate and complete.  Alexander Iba, PA-C

## 2023-09-08 NOTE — Patient Instructions (Signed)
 It was great to see you!  You have eustachian tube dysfunction -- continue what you are doing w allergy pills and nasal sprays You do not have any wax build-up  If ear pressure or sinus pressure persists > today 7 days, start antibiotic(s)   Take care,  Alexander Iba PA-C

## 2023-09-10 ENCOUNTER — Other Ambulatory Visit (HOSPITAL_BASED_OUTPATIENT_CLINIC_OR_DEPARTMENT_OTHER): Payer: Self-pay | Admitting: Family Medicine

## 2023-09-17 ENCOUNTER — Other Ambulatory Visit: Payer: Self-pay

## 2023-09-17 DIAGNOSIS — L309 Dermatitis, unspecified: Secondary | ICD-10-CM

## 2023-09-17 MED ORDER — DUPIXENT 300 MG/2ML ~~LOC~~ SOAJ: 300.0000 mg | SUBCUTANEOUS | 5 refills | Status: DC

## 2023-09-17 NOTE — Progress Notes (Signed)
Fax received requesting refills

## 2023-10-09 ENCOUNTER — Other Ambulatory Visit (HOSPITAL_BASED_OUTPATIENT_CLINIC_OR_DEPARTMENT_OTHER): Payer: Self-pay | Admitting: Family Medicine

## 2023-10-28 ENCOUNTER — Other Ambulatory Visit (HOSPITAL_BASED_OUTPATIENT_CLINIC_OR_DEPARTMENT_OTHER): Payer: Self-pay | Admitting: Family

## 2023-10-28 NOTE — Telephone Encounter (Signed)
 Patient asking for Zyrtec  refill but this was sent in from a different provider, please review.

## 2023-10-29 ENCOUNTER — Other Ambulatory Visit: Payer: Self-pay | Admitting: Family

## 2023-10-29 ENCOUNTER — Encounter: Payer: Self-pay | Admitting: Family

## 2023-10-29 DIAGNOSIS — Z3009 Encounter for other general counseling and advice on contraception: Secondary | ICD-10-CM

## 2023-10-29 NOTE — Telephone Encounter (Signed)
 Please review refill request

## 2023-12-05 ENCOUNTER — Encounter: Payer: Self-pay | Admitting: Family

## 2023-12-07 NOTE — Telephone Encounter (Signed)
 needs OV to discuss, medicaid will cover wegovy which is safer than phentermine if she wants to research this, thx.

## 2023-12-11 ENCOUNTER — Ambulatory Visit (INDEPENDENT_AMBULATORY_CARE_PROVIDER_SITE_OTHER): Payer: Self-pay | Admitting: Family

## 2023-12-11 ENCOUNTER — Encounter: Payer: Self-pay | Admitting: Family

## 2023-12-11 DIAGNOSIS — Z6841 Body Mass Index (BMI) 40.0 and over, adult: Secondary | ICD-10-CM

## 2023-12-11 DIAGNOSIS — Z713 Dietary counseling and surveillance: Secondary | ICD-10-CM

## 2023-12-11 NOTE — Progress Notes (Signed)
 Patient ID: Madison Watson, female    DOB: Dec 19, 2005, 18 y.o.   MRN: [id number]  Chief Complaint  Patient presents with   Weight Loss    Pt would like to discuss wegovy.  Discussed the use of AI scribe software for clinical note transcription with the patient, who gave verbal consent to proceed.  History of Present Illness Madison Watson is a 18 year old female who presents with her mother and with concerns about weight loss and nutrition management.  Weight management and nutrition - Desires weight loss and improved nutrition - Has researched Wegovy, an injectable medication for weight loss - Mother currently using Zepbound - Aware of potential side effects of Wegovy, including nausea, heartburn, constipation, and diarrhea - Walks regularly as part of her physical activity routine  Medication access and insurance coverage - Currently in a transition phase with insurance coverage, limited to family planning services - Working with a Sports coach to transition to a plan that will cover weight loss medication and other treatments - Exploring Medicaid plans that may cover [phone] - Currently self-administers Dupixent  300 mg and Cosentyx  using a pen - Has experienced issues with insurance coverage for Dupixent  and Cosentyx  and is ensuring new insurance plan will cover these medications  Assessment & Plan Morbid Obesity Discussed Tzhncb for weight management, potential side effects, and insurance coverage issues. Emphasized gradual weight loss, exercise, and dietary changes. - Submit prescription for [employer] once insurance coverage is confirmed. - Refer to weight loss clinic for education and support once insurance coverage is confirmed. - Encourage regular exercise (walking, gym equipment, biking) or other forms that can be fun such as Zumba or aerobics. - Advise dietary modifications including low carbohydrate intake, avoiding white carbs, and increasing protein intake. - F/U in 1  month after starting medication  Follow-up Plans to follow up on insurance coverage and Wegovy initiation. Regular follow-ups to monitor progress and adjust treatment. - Follow up regarding insurance coverage for [facility] and other medications. - Schedule follow-up appointments every 1-2 months to monitor progress and adjust treatment.  Subjective:    Outpatient Medications Prior to Visit  Medication Sig Dispense Refill   cetirizine  (ZYRTEC ) 10 MG tablet TAKE 1 TABLET BY MOUTH EVERY DAY 30 tablet 5   desonide  (DESOWEN ) 0.05 % ointment Apply 1 Application topically 2 (two) times daily. 60 g 2   Dupilumab  (DUPIXENT ) 300 MG/2ML SOAJ Inject 300 mg into the skin every 14 (fourteen) days. Starting at day 15 for maintenance. 4 mL 5   Fluocinolone  Acetonide Body (DERMA-SMOOTHE /FS BODY) 0.01 % OIL Apply 1 Application topically daily as needed (Daily as needed for dandruff flars). 118.28 mL 1   FLUoxetine  (PROZAC ) 10 MG capsule Take 1 capsule (10 mg total) by mouth daily. 90 capsule 3   norethindrone-ethinyl estradiol-iron (LOESTRIN FE) 1.5-30 MG-MCG tablet Take 1 tablet by mouth daily. 28 tablet 11   Secukinumab , 300 MG Dose, (COSENTYX  SENSOREADY, 300 MG,) 150 MG/ML SOAJ Inject 2 mLs (300 mg total) into the skin every 28 (twenty-eight) days. For maintenance. 2 mL 5   Secukinumab , 300 MG Dose, (COSENTYX  SENSOREADY, 300 MG,) 150 MG/ML SOAJ Inject 2 mLs (300 mg total) into the skin as directed. On week 0, 1, 2, 3 and 4. 10 mL 0   triamcinolone  cream (KENALOG ) 0.5 % Apply 1 Application topically 2 (two) times daily. To affected areas. 45 g 3   amoxicillin -clavulanate (AUGMENTIN ) 875-125 MG tablet Take 1 tablet by mouth 2 (two) times daily. (Patient  not taking: Reported on 12/11/2023) 14 tablet 0   Facility-Administered Medications Prior to Visit  Medication Dose Route Frequency Provider Last Rate Last Admin   dupilumab  (DUPIXENT ) prefilled syringe 300 mg  300 mg Subcutaneous Once Claudene Lehmann, MD        Past Medical History:  Diagnosis Date   Allergic rhinitis 01/18/2014   Birth control counseling 11/13/2022   Cheilitis due to atopic dermatitis 08/06/2023   Childhood asthma    Class 2 severe obesity due to excess calories with serious comorbidity and body mass index (BMI) of 36.0 to 36.9 in adult [facility]) 11/13/2022   Dandruff 01/30/2023   Screen for sexually transmitted diseases 03/06/2023   No past surgical history on file. No Known Allergies    Objective:    Physical Exam Vitals and nursing note reviewed.  Constitutional:      Appearance: Normal appearance. She is morbidly obese.  Cardiovascular:     Rate and Rhythm: Normal rate and regular rhythm.  Pulmonary:     Effort: Pulmonary effort is normal.     Breath sounds: Normal breath sounds.  Musculoskeletal:        General: Normal range of motion.  Skin:    General: Skin is warm and dry.  Neurological:     Mental Status: She is alert.  Psychiatric:        Mood and Affect: Mood normal.        Behavior: Behavior normal.    BP 119/82 (BP Location: Left Arm, Patient Position: Sitting, Cuff Size: Large)   Pulse 72   Temp 98 F (36.7 C) (Temporal)   Ht 5' 6 (1.676 m)   Wt 254 lb 4 oz (115.3 kg)   LMP 09/28/2023 (Exact Date)   SpO2 97%   BMI 41.04 kg/m  Wt Readings from Last 3 Encounters:  12/11/23 254 lb 4 oz (115.3 kg)  09/08/23 254 lb 8 oz (115.4 kg)  09/04/23 255 lb (115.7 kg)       Lucius Krabbe, NP

## 2023-12-11 NOTE — Assessment & Plan Note (Signed)
 Discussed Tzhncb for weight management, potential side effects, and insurance coverage issues. Emphasized gradual weight loss, exercise, and dietary changes. - Submit prescription for [employer] once insurance coverage is confirmed. - Refer to weight loss clinic for education and support once insurance coverage is confirmed. - Encourage regular exercise (walking, gym equipment, biking) or other forms that can be fun such as Zumba or aerobics. - Advise dietary modifications including low carbohydrate intake, avoiding white carbs, and increasing protein intake. - F/U in 1 month after starting medication

## 2023-12-11 NOTE — Patient Instructions (Signed)
 It was very nice to see you today!   Let me know when you have signed up for Medicaid so I can get the [facility] started!  Have a great weekend!  Start working on what we discussed today regarding your weight loss goals:  1) Eat small portions, ok to eat 6 mini meals vs. 3 big meals if this controls your hunger  better. 2) Eat until full and then STOP! This is your body telling you it has had enough. 3) Drink at least 64 oz water = 2 liters = 8, 8oz cups DAILY. 4) Eat most of your calories earlier in the day (if you work during the day).  Want to eat within a 8-10hour window if possible. No eating after 7pm, only no calorie drinks or water from 7p until bedtime. 5) Look for low carb, high protein foods/recipes. Avoid simple carbs including sweets, white bread, rice, potatoes, pasta. Look for whole grain/whole wheat/or almond flour if eating these foods. 6) High protein foods: meats (limit red meat), including [place], chicken, pork, FISH (best source) nuts, cheese, beans (black beans, soy/edamame beans are best). Protein drinks, bars are also good but must have low sugar, look for < 10 grams. 7) Avoid processed/refined sugar and artificial sweeteners if possible. Stevia, Erythritol, or Monk fruit sweetener are preferred, natural, no calorie sweeteners.  Natural sweeteners like Agave or honey in very small amounts are ok also.       PLEASE NOTE:  If you had any lab tests please let us  know if you have not heard back within a few days. You may see your results on MyChart before we have a chance to review them but we will give you a call once they are reviewed by us . If we ordered any referrals today, please let us  know if you have not heard from their office within the next week.

## 2023-12-29 ENCOUNTER — Encounter: Payer: Self-pay | Admitting: Family

## 2023-12-29 NOTE — Telephone Encounter (Signed)
 ok to go ahead and try to submit under morbid obesity dx and send PA request to pharmacy team and a referral to [facility] also please.

## 2023-12-30 MED ORDER — WEGOVY 0.25 MG/0.5ML ~~LOC~~ SOAJ: 0.2500 mg | SUBCUTANEOUS | 0 refills | Status: DC

## 2023-12-31 ENCOUNTER — Telehealth: Payer: Self-pay

## 2023-12-31 ENCOUNTER — Other Ambulatory Visit (HOSPITAL_COMMUNITY): Payer: Self-pay

## 2023-12-31 ENCOUNTER — Encounter (INDEPENDENT_AMBULATORY_CARE_PROVIDER_SITE_OTHER): Payer: Self-pay

## 2023-12-31 NOTE — Telephone Encounter (Signed)
 Pharmacy Patient Advocate Encounter  Received notification from [facility] that Prior Authorization for Wegovy  0.25MG /0.5ML auto-injectors  has been APPROVED from 12/31/23 to 07/02/24. Ran test claim, Copay is $0. This test claim was processed through [facility]- copay amounts may vary at other pharmacies due to pharmacy/plan contracts, or as the patient moves through the different stages of their insurance plan.   PA #/Case ID/Reference #: [id number]

## 2023-12-31 NOTE — Telephone Encounter (Signed)
 Pharmacy Patient Advocate Encounter   Received notification from Physician's Office that prior authorization for Wegovy  0.25MG /0.5ML auto-injectors is required/requested.   Insurance verification completed.   The patient is insured through [facility] .   Per test claim: PA required; PA submitted to above mentioned insurance via CoverMyMeds Key/confirmation #/EOC [id number] Status is pending

## 2024-01-18 ENCOUNTER — Encounter (INDEPENDENT_AMBULATORY_CARE_PROVIDER_SITE_OTHER): Payer: Self-pay | Admitting: Internal Medicine

## 2024-01-18 ENCOUNTER — Ambulatory Visit (INDEPENDENT_AMBULATORY_CARE_PROVIDER_SITE_OTHER): Admitting: Internal Medicine

## 2024-01-18 ENCOUNTER — Institutional Professional Consult (permissible substitution) (INDEPENDENT_AMBULATORY_CARE_PROVIDER_SITE_OTHER): Admitting: Nurse Practitioner

## 2024-01-18 VITALS — BP 119/75 | HR 85 | Temp 98.3°F | Ht 65.0 in | Wt 248.0 lb

## 2024-01-18 DIAGNOSIS — L83 Acanthosis nigricans: Secondary | ICD-10-CM | POA: Diagnosis not present

## 2024-01-18 DIAGNOSIS — E66813 Obesity, class 3: Secondary | ICD-10-CM | POA: Diagnosis not present

## 2024-01-18 DIAGNOSIS — R638 Other symptoms and signs concerning food and fluid intake: Secondary | ICD-10-CM

## 2024-01-18 DIAGNOSIS — L732 Hidradenitis suppurativa: Secondary | ICD-10-CM

## 2024-01-18 DIAGNOSIS — Z6841 Body Mass Index (BMI) 40.0 and over, adult: Secondary | ICD-10-CM

## 2024-01-18 NOTE — Assessment & Plan Note (Signed)
 Contributing factors: family history of obesity, disruption of circadian rhythm / sleep disordered breathing, consumption of processed foods, moderate to high levels of stress, mental health problems, strong orexigenic signaling and/or inadequate inhibitory control , need for convenience due to lack of time, and multiple weight loss attempts in the past.  We reviewed anthropometrics, biometrics, associated medical conditions and contributing factors with patient. she would benefit from a medically tailored reduced calorie nutrional plan based on her REE (resting energy expenditure), which will be determined by indirect calorimetry.  We will also assess for cardiometabolic risk and nutritional derangements via fasting labs at intake appointment.

## 2024-01-18 NOTE — Assessment & Plan Note (Signed)
 Patient has a dark velvety rash on her neck consistent with acanthosis nigricans.  This may indicate insulin resistance.  She does not have a history of prediabetes.  She will benefit from being screened for glycemic disorders at intake appointment.  We will check a fasting insulin level, fasting glucose and hemoglobin A1c.

## 2024-01-18 NOTE — Assessment & Plan Note (Signed)
 Patient is suspicious for having brain and emotional hunger she may benefit from antiobesity medications in addition to nutrition and behavioral strategies.

## 2024-01-18 NOTE — Assessment & Plan Note (Signed)
 Involving underarms and groin compounded by high body weight.  She is currently receiving treatment by dermatologist.  Losing weight may improve condition.

## 2024-01-18 NOTE — Progress Notes (Deleted)
 [address] [place], [place], [place] [address] Office: [phone]  /  Fax: [phone]   Initial Consultation    Madison Watson was seen in clinic today to evaluate for obesity. She is interested in losing weight to improve overall health and reduce the risk of weight related complications. She presents today to review program treatment options, initial physical assessment, and evaluation.    Madison Watson has a history of mixed hyperlipidemia- most recent lipid panel 04/2023 showed cholesterol 202, LDL 109 and triglycerides of 231, not fasting. She has a history of anxiety and is currently taking Prozac  10 mg every day with control of symptoms.  Madison Watson has a BMI of 41 and was started on Wegovy  0.25 mg SQ QW by PCP, Corean Comment NP and referred to our office for weight loss management. She is currently on Dupixent  for scalp eczema and Cosentyx  for hidradenitis suppurativa. She is followed by dermatology.  Anthropometrics and Bioimpedance Analysis   There is no height or weight on file to calculate BMI. Body Fat Mass : *** % Visceral Fat Mass Rating : *** Waist to Height Ratio: ***  Obesity Related Diseases and Complications  Obesity Quality of Life and Psychosocial Complications: {emqolpsychosoc:33006::Reduced health-related quality of life}  Cardiometabolic: {emcardiometabolic complications:33007}  Biomechanical: {embiomechanical:33008}   Weight Related History  She was referred by: {emreferby:28303}  When asked what they would like to accomplish? She states: {EMHopetoaccomplish:28304::Adopt a healthier eating pattern and lifestyle,Improve energy levels and physical activity,Improve existing medical conditions,Improve quality of life}  Weight history: ***  Highest weight: ***  Contributing factors: {EMcontributingfactors:28307}  Prior weight loss attempts: {emweightlossprograms:31590::None}  Current or previous pharmacotherapy: {EM previousRx:28311}  Response  to medication: {EMResponsetomedication:28312}  Current nutrition plan: {EMNutritionplan:28309::None}  Greatest challenge with dieting: {emgreatestchallengediet:31593}.  Current level of physical activity: {EMcurrentPA:28310::None}  Barriers to Exercise: {embarrierstoexercise:32606::no barriers}  Readiness and Motivation  On a scale from 0 to 10 How ready are you to make changes to your eating and physical activity to lose weight? {NUMBER 1-10:22536} How important is it for you to lose weight right now ? {NUMBER 1-10:22536} How confident are you that you can lose weight if you try? {NUMBER [id number]  Past Medical History   Past Medical History:  Diagnosis Date   ADD (attention deficit disorder) 01/18/2014   Diagnosed at [facility] in 2012.     Allergic rhinitis 01/18/2014   Birth control counseling 11/13/2022   Cheilitis due to atopic dermatitis 08/06/2023   Childhood asthma    Class 2 severe obesity due to excess calories with serious comorbidity and body mass index (BMI) of 36.0 to 36.9 in adult [facility]) 11/13/2022   Dandruff 01/30/2023   Screen for sexually transmitted diseases 03/06/2023     Objective    There were no vitals taken for this visit. She was weighed on the bioimpedance scale: There is no height or weight on file to calculate BMI.    General:  Alert, oriented and cooperative. Patient is in no acute distress.  Respiratory: Normal respiratory effort, no problems with respiration noted   Gait: able to ambulate independently  Mental Status: Normal mood and affect. Normal behavior. Normal judgment and thought content.   Diagnostic Data Reviewed  BMET    Component Value Date/Time   NA 141 04/28/2023 1641   K 4.2 04/28/2023 1641   CL 105 04/28/2023 1641   CO2 22 04/28/2023 1641   GLUCOSE 91 04/28/2023 1641   BUN 8 04/28/2023 1641   CREATININE 0.84 04/28/2023 1641  CALCIUM 9.4 04/28/2023 1641   No results found for: HGBA1C No  results found for: INSULIN CBC    Component Value Date/Time   WBC 7.1 04/28/2023 1641   RBC 4.57 04/28/2023 1641   HGB 13.6 04/28/2023 1641   HCT 41.6 04/28/2023 1641   PLT 157 04/28/2023 1641   MCV 91 04/28/2023 1641   MCH 29.8 04/28/2023 1641   MCHC 32.7 04/28/2023 1641   RDW 12.7 04/28/2023 1641   Iron/TIBC/Ferritin/ %Sat No results found for: IRON, TIBC, FERRITIN, IRONPCTSAT Lipid Panel     Component Value Date/Time   CHOL 202 (H) 04/28/2023 1641   TRIG 231 (H) 04/28/2023 1641   HDL 53 04/28/2023 1641   CHOLHDL 3.8 04/28/2023 1641   LDLCALC 109 04/28/2023 1641   Hepatic Function Panel     Component Value Date/Time   PROT 6.9 04/28/2023 1641   ALBUMIN 4.3 04/28/2023 1641   AST 23 04/28/2023 1641   ALT 25 04/28/2023 1641   ALKPHOS 50 04/28/2023 1641   BILITOT 0.3 04/28/2023 1641   No results found for: TSH  Medications  Outpatient Encounter Medications as of 01/18/2024  Medication Sig   cetirizine  (ZYRTEC ) 10 MG tablet TAKE 1 TABLET BY MOUTH EVERY DAY   desonide  (DESOWEN ) 0.05 % ointment Apply 1 Application topically 2 (two) times daily.   Dupilumab  (DUPIXENT ) 300 MG/2ML SOAJ Inject 300 mg into the skin every 14 (fourteen) days. Starting at day 15 for maintenance.   Fluocinolone  Acetonide Body (DERMA-SMOOTHE /FS BODY) 0.01 % OIL Apply 1 Application topically daily as needed (Daily as needed for dandruff flars).   FLUoxetine  (PROZAC ) 10 MG capsule Take 1 capsule (10 mg total) by mouth daily.   norethindrone-ethinyl estradiol-iron (LOESTRIN FE) 1.5-30 MG-MCG tablet Take 1 tablet by mouth daily.   Secukinumab , 300 MG Dose, (COSENTYX  SENSOREADY, 300 MG,) 150 MG/ML SOAJ Inject 2 mLs (300 mg total) into the skin every 28 (twenty-eight) days. For maintenance.   semaglutide -weight management (WEGOVY ) 0.25 MG/0.5ML SOAJ SQ injection Inject 0.25 mg into the skin once a week.   triamcinolone  cream (KENALOG ) 0.5 % Apply 1 Application topically 2 (two) times daily. To  affected areas.   No facility-administered encounter medications on file as of 01/18/2024.     Assessment and Plan   There are no diagnoses linked to this encounter.     Obesity Treatment and Action Plan:  {EMobesityactionplanscribe:28314::Patient will work on garnering support from family and friends to begin weight loss journey.,Will work on eliminating or reducing the presence of highly palatable, calorie dense foods in the home.,Will complete provided nutritional and psychosocial assessment questionnaire before the next appointment.,Will be scheduled for indirect calorimetry to determine resting energy expenditure in a fasting state.  This will allow us  to create a reduced calorie, high-protein meal plan to promote loss of fat mass while preserving muscle mass.,Counseled on the health benefits of losing 5%-15% of total body weight.,Was counseled on nutritional approaches to weight loss and benefits of reducing processed foods and consuming plant-based foods and high quality protein as part of nutritional weight management.,Was counseled on pharmacotherapy and role as an adjunct in weight management. }  Education and Additional resources  She was weighed on the bioimpedance scale and results were discussed and documented in the synopsis.  We discussed obesity as a progressive, chronic disease and the importance of a more detailed evaluation of all the factors contributing to the disease.  We reviewed the basic principles in obesity management.   We discussed the importance of  long term lifestyle changes which include nutrition, exercise and behavioral modification as well as the importance of customizing this to her specific health and social needs.  We reviewed the role of medical interventions including pharmacotherapy and surgical interventions.   We discussed the benefits of reaching a healthier weight to alleviate the symptoms of existing conditions and reduce the risks  of the biomechanical, cardiometabolic and psychological effects of obesity.  We reviewed our program approach and philosophy, which are guided by the four pillars of obesity medicine.  We discussed how to prepare for intake appointment and the importance of fasting and avoidance of stimulants for at least 8 hours prior to indirect calorimetry.  Ellena Y Verner appears to be in the action stage of change and reports being ready to initiate intensive lifestyle and behavioral modifications as part of their weight loss journey.  Attestation  Reviewed by clinician on day of visit: allergies, medications, problem list, medical history, surgical history, family history, social history, and previous encounter notes pertinent to obesity diagnosis.  I have spent *** minutes in the care of the patient today including: {NUMBER 1-10:22536} minutes before the visit reviewing and preparing the chart. *** minutes face-to-face {emfacetoface:32598::assessing and reviewing listed medical problems as outlined in obesity care plan,providing nutritional and behavioral counseling on topics outlined in the obesity care plan,independently interpreting test results and goals of care, as described in assessment and plan,reviewing and discussing biometric information and progress} {NUMBER 1-10:22536} minutes after the visit updating chart and documentation of encounter.  Daune Divirgilio ANP-C

## 2024-01-18 NOTE — Progress Notes (Signed)
 [address] [place], [place], [place] [address] Office: [phone]  /  Fax: [phone]   Initial Consultation    Madison Watson was seen in clinic today to evaluate for obesity. She is interested in losing weight to improve overall health and reduce the risk of weight related complications. She presents today to review program treatment options, initial physical assessment, and evaluation.    Anthropometrics and Bioimpedance Analysis   Body mass index is 41.27 kg/m. Body Fat Mass : 47 % Visceral Fat Mass Rating : 11 Waist to Height Ratio: TBD  Obesity Related Diseases and Complications  Obesity Quality of Life and Psychosocial Complications: Depression and / or anxiety, Reduced health-related quality of life, and affected self-esteem  Cardiometabolic: None  Biomechanical: None   Weight Related History  She was referred by: PCP  When asked what they would like to accomplish? She states: Adopt a healthier eating pattern and lifestyle, Improve energy levels and physical activity, Improve existing medical conditions, Improve quality of life, and Improve self-confidence  Weight history:   Highest weight: 250  Contributing factors: family history of obesity, disruption of circadian rhythm / sleep disordered breathing, consumption of processed foods, moderate to high levels of stress, mental health problems, strong orexigenic signaling and/or inadequate inhibitory control , need for convenience due to lack of time, and multiple weight loss attempts in the past  Prior weight loss attempts: Fad diets  Current or previous pharmacotherapy: Other: Wegovy  started by PCP  Response to medication: Started recently  Current nutrition plan: None  Greatest challenge with dieting: no weight loss.  Current level of physical activity: Walking 40 minutes, five a week  Barriers to Exercise: no barriers   Past Medical History   Past Medical History:  Diagnosis Date   ADD (attention  deficit disorder) 01/18/2014   Diagnosed at [facility] in 2012.     Allergic rhinitis 01/18/2014   Birth control counseling 11/13/2022   Cheilitis due to atopic dermatitis 08/06/2023   Childhood asthma    Class 2 severe obesity due to excess calories with serious comorbidity and body mass index (BMI) of 36.0 to 36.9 in adult [facility]) 11/13/2022   Dandruff 01/30/2023   Screen for sexually transmitted diseases 03/06/2023     Objective    BP 119/75   Pulse 85   Temp 98.3 F (36.8 C)   Ht 5' 5 (1.651 m)   Wt 248 lb (112.5 kg)   SpO2 98%   BMI 41.27 kg/m  She was weighed on the bioimpedance scale: Body mass index is 41.27 kg/m.    General:  Alert, oriented and cooperative. Patient is in no acute distress.  Respiratory: Normal respiratory effort, no problems with respiration noted   Gait: able to ambulate independently  Mental Status: Normal mood and affect. Normal behavior. Normal judgment and thought content.   Diagnostic Data Reviewed  BMET    Component Value Date/Time   NA 141 04/28/2023 1641   K 4.2 04/28/2023 1641   CL 105 04/28/2023 1641   CO2 22 04/28/2023 1641   GLUCOSE 91 04/28/2023 1641   BUN 8 04/28/2023 1641   CREATININE 0.84 04/28/2023 1641   CALCIUM 9.4 04/28/2023 1641   No results found for: HGBA1C No results found for: INSULIN CBC    Component Value Date/Time   WBC 7.1 04/28/2023 1641   RBC 4.57 04/28/2023 1641   HGB 13.6 04/28/2023 1641   HCT 41.6 04/28/2023 1641   PLT 157 04/28/2023 1641   MCV  91 04/28/2023 1641   MCH 29.8 04/28/2023 1641   MCHC 32.7 04/28/2023 1641   RDW 12.7 04/28/2023 1641   Iron/TIBC/Ferritin/ %Sat No results found for: IRON, TIBC, FERRITIN, IRONPCTSAT Lipid Panel     Component Value Date/Time   CHOL 202 (H) 04/28/2023 1641   TRIG 231 (H) 04/28/2023 1641   HDL 53 04/28/2023 1641   CHOLHDL 3.8 04/28/2023 1641   LDLCALC 109 04/28/2023 1641   Hepatic Function Panel     Component Value  Date/Time   PROT 6.9 04/28/2023 1641   ALBUMIN 4.3 04/28/2023 1641   AST 23 04/28/2023 1641   ALT 25 04/28/2023 1641   ALKPHOS 50 04/28/2023 1641   BILITOT 0.3 04/28/2023 1641   No results found for: TSH  Medications  Outpatient Encounter Medications as of 01/18/2024  Medication Sig   cetirizine  (ZYRTEC ) 10 MG tablet TAKE 1 TABLET BY MOUTH EVERY DAY   desonide  (DESOWEN ) 0.05 % ointment Apply 1 Application topically 2 (two) times daily.   Dupilumab  (DUPIXENT ) 300 MG/2ML SOAJ Inject 300 mg into the skin every 14 (fourteen) days. Starting at day 15 for maintenance.   Fluocinolone  Acetonide Body (DERMA-SMOOTHE /FS BODY) 0.01 % OIL Apply 1 Application topically daily as needed (Daily as needed for dandruff flars).   FLUoxetine  (PROZAC ) 10 MG capsule Take 1 capsule (10 mg total) by mouth daily.   norethindrone-ethinyl estradiol-iron (LOESTRIN FE) 1.5-30 MG-MCG tablet Take 1 tablet by mouth daily.   Secukinumab , 300 MG Dose, (COSENTYX  SENSOREADY, 300 MG,) 150 MG/ML SOAJ Inject 2 mLs (300 mg total) into the skin every 28 (twenty-eight) days. For maintenance.   semaglutide -weight management (WEGOVY ) 0.25 MG/0.5ML SOAJ SQ injection Inject 0.25 mg into the skin once a week.   triamcinolone  cream (KENALOG ) 0.5 % Apply 1 Application topically 2 (two) times daily. To affected areas.   No facility-administered encounter medications on file as of 01/18/2024.     Assessment and Plan   Hidradenitis suppurativa  Acanthosis nigricans  Class 3 severe obesity with serious comorbidity and body mass index (BMI) of 40.0 to 44.9 in adult, unspecified obesity type   Assessment & Plan Hidradenitis suppurativa Involving underarms and groin compounded by high body weight.  She is currently receiving treatment by dermatologist.  Losing weight may improve condition. Acanthosis nigricans Patient has a dark velvety rash on her neck consistent with acanthosis nigricans.  This may indicate insulin resistance.   She does not have a history of prediabetes.  She will benefit from being screened for glycemic disorders at intake appointment.  We will check a fasting insulin level, fasting glucose and hemoglobin A1c. Class 3 severe obesity with serious comorbidity and body mass index (BMI) of 40.0 to 44.9 in adult, unspecified obesity type Contributing factors: family history of obesity, disruption of circadian rhythm / sleep disordered breathing, consumption of processed foods, moderate to high levels of stress, mental health problems, strong orexigenic signaling and/or inadequate inhibitory control , need for convenience due to lack of time, and multiple weight loss attempts in the past.  We reviewed anthropometrics, biometrics, associated medical conditions and contributing factors with patient. she would benefit from a medically tailored reduced calorie nutrional plan based on her REE (resting energy expenditure), which will be determined by indirect calorimetry.  We will also assess for cardiometabolic risk and nutritional derangements via fasting labs at intake appointment.   Abnormal food appetite Patient is suspicious for having brain and emotional hunger she may benefit from antiobesity medications in addition to nutrition and behavioral  strategies.      Obesity Treatment and Action Plan:  Patient will work on garnering support from family and friends to begin weight loss journey. Will work on eliminating or reducing the presence of highly palatable, calorie dense foods in the home. Will complete provided nutritional and psychosocial assessment questionnaire before the next appointment. Will be scheduled for indirect calorimetry to determine resting energy expenditure in a fasting state.  This will allow us  to create a reduced calorie, high-protein meal plan to promote loss of fat mass while preserving muscle mass. Counseled on the health benefits of losing 5%-15% of total body weight. Was counseled on  nutritional approaches to weight loss and benefits of reducing processed foods and consuming plant-based foods and high quality protein as part of nutritional weight management. Was counseled on pharmacotherapy and role as an adjunct in weight management.   Education and Additional resources  She was weighed on the bioimpedance scale and results were discussed and documented in the synopsis.  We discussed obesity as a progressive, chronic disease and the importance of a more detailed evaluation of all the factors contributing to the disease.  We reviewed the basic principles in obesity management.   We discussed the importance of long term lifestyle changes which include nutrition, exercise and behavioral modification as well as the importance of customizing this to her specific health and social needs.  We reviewed the role of medical interventions including pharmacotherapy and surgical interventions.   We discussed the benefits of reaching a healthier weight to alleviate the symptoms of existing conditions and reduce the risks of the biomechanical, cardiometabolic and psychological effects of obesity.  We reviewed our program approach and philosophy, which are guided by the four pillars of obesity medicine.  We discussed how to prepare for intake appointment and the importance of fasting and avoidance of stimulants for at least 8 hours prior to indirect calorimetry.  Madison Watson appears to be in the action stage of change and reports being ready to initiate intensive lifestyle and behavioral modifications as part of their weight loss journey.  Attestation  Reviewed by clinician on day of visit: allergies, medications, problem list, medical history, surgical history, family history, social history, and previous encounter notes pertinent to obesity diagnosis.  I have spent 42 minutes in the care of the patient today including: 2 minutes before the visit reviewing and preparing the  chart. 32 minutes face-to-face assessing and reviewing listed medical problems as outlined in obesity care plan, providing nutritional and behavioral counseling on topics outlined in the obesity care plan, independently interpreting test results and goals of care, as described in assessment and plan, reviewing and discussing biometric information and progress, and reviewing latest PCP notes and specialist consultations 8 minutes after the visit updating chart and documentation of encounter.   Lucas Parker, MD, ABIM, [place]

## 2024-01-24 ENCOUNTER — Other Ambulatory Visit (HOSPITAL_BASED_OUTPATIENT_CLINIC_OR_DEPARTMENT_OTHER): Payer: Self-pay | Admitting: Family Medicine

## 2024-01-25 ENCOUNTER — Other Ambulatory Visit: Payer: Self-pay | Admitting: Family

## 2024-01-26 ENCOUNTER — Other Ambulatory Visit: Payer: Self-pay | Admitting: Family

## 2024-01-29 ENCOUNTER — Encounter: Payer: Self-pay | Admitting: Family

## 2024-01-29 DIAGNOSIS — F419 Anxiety disorder, unspecified: Secondary | ICD-10-CM

## 2024-02-01 MED ORDER — FLUOXETINE HCL 10 MG PO CAPS
10.0000 mg | ORAL_CAPSULE | Freq: Every day | ORAL | 1 refills | Status: AC
Start: 2024-02-01 — End: ?

## 2024-02-01 NOTE — Telephone Encounter (Signed)
 RX sent

## 2024-02-23 ENCOUNTER — Ambulatory Visit: Admitting: Dermatology

## 2024-03-11 ENCOUNTER — Encounter: Payer: Self-pay | Admitting: Family

## 2024-03-11 ENCOUNTER — Ambulatory Visit: Admitting: Family

## 2024-03-11 ENCOUNTER — Other Ambulatory Visit (HOSPITAL_COMMUNITY)
Admission: RE | Admit: 2024-03-11 | Discharge: 2024-03-11 | Disposition: A | Source: Ambulatory Visit | Attending: Family | Admitting: Family

## 2024-03-11 VITALS — BP 120/82 | HR 70 | Temp 97.3°F | Ht 65.0 in | Wt 255.2 lb

## 2024-03-11 DIAGNOSIS — Z0000 Encounter for general adult medical examination without abnormal findings: Secondary | ICD-10-CM

## 2024-03-11 DIAGNOSIS — Z13220 Encounter for screening for lipoid disorders: Secondary | ICD-10-CM

## 2024-03-11 DIAGNOSIS — Z23 Encounter for immunization: Secondary | ICD-10-CM | POA: Diagnosis not present

## 2024-03-11 DIAGNOSIS — Z113 Encounter for screening for infections with a predominantly sexual mode of transmission: Secondary | ICD-10-CM

## 2024-03-11 DIAGNOSIS — Z6841 Body Mass Index (BMI) 40.0 and over, adult: Secondary | ICD-10-CM

## 2024-03-11 LAB — CBC WITH DIFFERENTIAL/PLATELET
Basophils Absolute: 0 K/uL (ref 0.0–0.1)
Basophils Relative: 0.4 % (ref 0.0–3.0)
Eosinophils Absolute: 0.1 K/uL (ref 0.0–0.7)
Eosinophils Relative: 1.4 % (ref 0.0–5.0)
HCT: 40.7 % (ref 36.0–46.0)
Hemoglobin: 13.5 g/dL (ref 12.0–15.0)
Lymphocytes Relative: 42 % (ref 12.0–46.0)
Lymphs Abs: 2.5 K/uL (ref 0.7–4.0)
MCHC: 33 g/dL (ref 30.0–36.0)
MCV: 89.4 fl (ref 78.0–100.0)
Monocytes Absolute: 0.6 K/uL (ref 0.1–1.0)
Monocytes Relative: 9.3 % (ref 3.0–12.0)
Neutro Abs: 2.8 K/uL (ref 1.4–7.7)
Neutrophils Relative %: 46.9 % (ref 43.0–77.0)
Platelets: 220 K/uL (ref 150.0–400.0)
RBC: 4.56 Mil/uL (ref 3.87–5.11)
RDW: 13.8 % (ref 11.5–14.6)
WBC: 6 K/uL (ref 4.5–10.5)

## 2024-03-11 LAB — COMPREHENSIVE METABOLIC PANEL WITH GFR
ALT: 21 U/L (ref 0–35)
AST: 23 U/L (ref 0–37)
Albumin: 4.3 g/dL (ref 3.5–5.2)
Alkaline Phosphatase: 42 U/L (ref 39–117)
BUN: 10 mg/dL (ref 6–23)
CO2: 25 meq/L (ref 19–32)
Calcium: 9.2 mg/dL (ref 8.4–10.5)
Chloride: 107 meq/L (ref 96–112)
Creatinine, Ser: 0.82 mg/dL (ref 0.40–1.20)
GFR: 102.86 mL/min (ref >60.00)
Glucose, Bld: 78 mg/dL (ref 70–99)
Potassium: 3.8 meq/L (ref 3.5–5.1)
Sodium: 139 meq/L (ref 135–145)
Total Bilirubin: 0.4 mg/dL (ref 0.2–1.2)
Total Protein: 7.2 g/dL (ref 6.0–8.3)

## 2024-03-11 LAB — LIPID PANEL
Cholesterol: 169 mg/dL (ref 0–200)
HDL: 58.6 mg/dL (ref >39.00)
LDL Cholesterol: 85 mg/dL (ref 0–99)
NonHDL: 110.66
Total CHOL/HDL Ratio: 3
Triglycerides: 130 mg/dL (ref 0.0–149.0)
VLDL: 26 mg/dL (ref 0.0–40.0)

## 2024-03-11 LAB — TSH: TSH: 1.37 u[IU]/mL (ref 0.35–5.50)

## 2024-03-11 MED ORDER — PHENTERMINE HCL 37.5 MG PO TABS: 18.2500 mg | ORAL_TABLET | Freq: Every day | ORAL | 0 refills | Status: DC

## 2024-03-11 NOTE — Progress Notes (Signed)
 Phone [phone]  Subjective:   Patient is a 18 y.o. female presenting for annual physical.    Chief Complaint  Patient presents with   Annual Exam    Non fasting w/ labs  Discussed the use of AI scribe software for clinical note transcription with the patient, who gave verbal consent to proceed.  History of Present Illness Madison Watson is a 18 year old female who presents for an annual physical exam and Pap smear.  She is sexually active and uses birth control pills, initially prescribed by her pediatrician and later refilled by another primary care provider. She has not had a Pap smear before.  She discontinued Wegovy  injections due to insurance coverage issues, resulting in increased hunger. She is exploring other weight management options but faces scheduling challenges with a wellness clinic due to school commitments. She engages in physical activity by walking on weekends with her sister at local parks, covering approximately five miles. She has access to exercise programs on TV and an app on her iPad but has not yet utilized them.  She experiences daily bowel movements, sometimes twice a day, with occasional runny stools after eating. She has experienced constipation in the past, causing cramping and pain.  She is currently taking Prozac , cetirizine , and birth control pills. Prozac  helps manage her anxiety, and she notes increased anxiety when not taking it. Her social history includes being a Consulting civil engineer and managing her schedule around school commitments. She has Medicaid and Blue Cross Blue Shield insurance, with [employer] as her primary insurance.  See problem oriented charting- ROS- full  review of systems was completed and negative except for what is noted in HPI above.  The following were reviewed and entered/updated in epic: Past Medical History:  Diagnosis Date   ADD (attention deficit disorder) 01/18/2014   Diagnosed at [facility] in 2012.      Allergic rhinitis 01/18/2014   Birth control counseling 11/13/2022   Cheilitis due to atopic dermatitis 08/06/2023   Childhood asthma    Class 2 severe obesity due to excess calories with serious comorbidity and body mass index (BMI) of 36.0 to 36.9 in adult 11/13/2022   Dandruff 01/30/2023   Screen for sexually transmitted diseases 03/06/2023   Patient Active Problem List   Diagnosis Date Noted   Acanthosis nigricans 01/18/2024   Abnormal food appetite 01/18/2024   Morbid obesity (HCC) 09/04/2023   Atopic dermatitis 96/68/7974   LSC (lichen simplex chronicus) 08/24/2023   Discoloration of skin 01/30/2023   Anxiety 12/30/2022   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  Family History  Problem Relation Age of Onset   Asthma Mother    Hypertension Father    Asthma Sister    Diabetes Maternal Grandmother    Asthma Other    Hypertension Other     Medications- reviewed and updated Current Outpatient Medications  Medication Sig Dispense Refill   cetirizine  (ZYRTEC ) 10 MG tablet TAKE 1 TABLET BY MOUTH EVERY DAY 30 tablet 5   desonide  (DESOWEN ) 0.05 % ointment Apply 1 Application topically 2 (two) times daily. 60 g 2   Fluocinolone  Acetonide Body (DERMA-SMOOTHE /FS BODY) 0.01 % OIL Apply 1 Application topically daily as needed (Daily as needed for dandruff flars). 118.28 mL 1   FLUoxetine  (PROZAC ) 10 MG capsule Take 1 capsule (10 mg total) by mouth daily. 90 capsule 1  norethindrone-ethinyl estradiol-iron (LOESTRIN FE) 1.5-30 MG-MCG tablet Take 1 tablet by mouth daily. 28 tablet 11   triamcinolone  cream (KENALOG ) 0.5 % Apply 1 Application topically 2 (two) times daily. To affected areas. 45 g 3   phentermine (ADIPEX-P) 37.5 MG tablet Take 0.5 tablets (18.75 mg total) by mouth daily before breakfast. 15 tablet 0   No current facility-administered  medications for this visit.    Allergies-reviewed and updated No Known Allergies  Social History   Social History Narrative   Not on file    Objective:  BP 120/82 (BP Location: Left Arm, Patient Position: Sitting, Cuff Size: Large)   Pulse 70   Temp (!) 97.3 F (36.3 C) (Temporal)   Ht 5' 5 (1.651 m)   Wt 255 lb 4 oz (115.8 kg)   LMP 10/07/2023 (Approximate)   SpO2 99%   BMI 42.48 kg/m  Physical Exam Vitals and nursing note reviewed. Exam conducted with a chaperone present.  Constitutional:      Appearance: Normal appearance. She is morbidly obese.  HENT:     Head: Normocephalic.     Right Ear: Tympanic membrane normal.     Left Ear: Tympanic membrane normal.     Nose: Nose normal.     Mouth/Throat:     Mouth: Mucous membranes are moist.  Eyes:     Pupils: Pupils are equal, round, and reactive to light.  Cardiovascular:     Rate and Rhythm: Normal rate and regular rhythm.  Pulmonary:     Effort: Pulmonary effort is normal.     Breath sounds: Normal breath sounds.  Genitourinary:    Exam position: Lithotomy position.     Pubic Area: No rash (folliculitis noted on right perineal) or pubic lice.      Labia:        Right: No rash.        Left: No rash.      Vagina: Normal.     Cervix: Normal.     Comments: PAP smear specimen obtained. Musculoskeletal:        General: Normal range of motion.     Cervical back: Normal range of motion.  Lymphadenopathy:     Cervical: No cervical adenopathy.  Skin:    General: Skin is warm and dry.  Neurological:     Mental Status: She is alert.  Psychiatric:        Mood and Affect: Mood normal.        Behavior: Behavior normal.     Assessment and Plan   Health Maintenance counseling: 1. Anticipatory guidance: Patient counseled regarding regular dental exams q6 months, eye exams,  avoiding smoking and second hand smoke, limiting alcohol to 1 beverage per day, no illicit drugs.   2. Risk factor reduction:  Advised patient  of need for regular exercise and diet rich with fruits and vegetables to reduce risk of heart attack and stroke. Wt Readings from Last 3 Encounters:  03/11/24 255 lb 4 oz (115.8 kg)  01/18/24 248 lb (112.5 kg)  12/11/23 254 lb 4 oz (115.3 kg)   3. Immunizations/screenings/ancillary studies Immunization History  Administered Date(s) Administered   DTaP 11/02/2003, 05/03/2004, 06/03/2004, 01/06/2005, 10/28/2007   Dtap, Unspecified 11/02/2003, 05/03/2004, 06/03/2004, 01/06/2005, 10/28/2007   HIB (PRP-OMP) 11/02/2003, 05/03/2004, 06/03/2004, 08/30/2004   HIB, Unspecified 11/02/2003, 05/03/2004, 06/03/2004, 08/30/2004   Hep A, Unspecified 03/16/2007, 07/01/2007   Hep B, Unspecified Jan 27, 2004, 11/02/2003, 05/03/2004   Hepatitis A, Ped/Adol-2 Dose 08/26/2005, 03/16/2007   Hepatitis B, PED/ADOLESCENT 12/24/03, 11/02/2003,  05/03/2004   Hpv-Unspecified 10/10/2015, 10/15/2016   IPV 11/02/2003, 05/03/2004, 06/03/2004, 10/28/2007   Influenza, Seasonal, Injecte, Preservative Fre 01/30/2023, 03/11/2024   Influenza,inj,Quad PF,6+ Mos 03/29/2021   MMR 08/30/2004, 08/31/2007, 10/28/2007   MenQuadfi_Meningococcal Groups ACYW Conjugate 10/08/2015, 10/14/2021   PFIZER(Purple Top)SARS-COV-2 Vaccination 08/25/2019, 09/19/2019, 05/21/2020   Pneumococcal Conjugate PCV 7 11/02/2003, 05/03/2004, 06/03/2004, 08/30/2004   Pneumococcal Conjugate-13 11/02/2003, 05/03/2004, 06/03/2004, 08/30/2004   Tdap 10/08/2015   Varicella 01/06/2005, 10/28/2007   There are no preventive care reminders to display for this patient.  4. Cervical cancer screening: never done 5. Skin cancer screening- advised regular sunscreen use. Denies worrisome, changing, or new skin lesions.  6. Birth control/STD check: OCP  7. Smoking associated screening: non- smoker 8. Alcohol screening:  none. 9. Exercise:  walking in the park most days Assessment & Plan Adult Wellness Visit Routine wellness visit focused on exercise and dietary  habits. Folliculitis noted on right lower buttock, perineal area. Advised on exfoliating with antibacterial soap and washcloth or loufah sponge daily. No hx of PAP smear, pt is sexually active. - Perform Pap smear and physical examination. - Order full CPE lab panel - Encourage continuation of physical activity and dietary modifications, including avoiding white carbs and sweets, focusing on whole grains, fiber-rich foods, and protein and vegetables for supper.  Screening for sexually transmitted infections Screening indicated due to sexual activity. - Testing for STDs to be done thru PAP smear specimen.  Immunization management Routine immunization management. - Administer flu shot.  Morbid Obesity Challenges in weight management with previous use of Wegovy  and insurance issues. Explored other options and referral to a wellness clinic. States the [facility] clinic are very strict about monthly appointments, not being late and do not schedule late afternoon slots. She will consider rejoining after her schedule calms down. - Provided handouts on weight loss tips and stress eating. - Consider referral to a nutritionist for dietary guidance as temporary solution to the [facility] clinic, prefer the clinic when her schedule allows in future. - Reviewed exercise and dietary modifications again. - Starting low dose Phentermine, advised on use & SE. - Schedule f/u visit for 1 month.  Bowel habit changes (alternating diarrhea and constipation) Alternating bowel habits with potential causes including dietary factors and stress. - Recommend dietary modifications to increase fiber intake. - Be sure drinking at least 2.5 liters of water daily. - Increase cardio exercise, up to 41m every day - Consider use of OTC Benefiber powder if needed.  Anxiety disorder Managed with fluoxetine , effective in symptom control. Increased anxiety when not on medication. - Continue fluoxetine  10mg  qd for anxiety management, no  refill needed today. - F/U in 3 mos  Immunization due -     Flu vaccine trivalent PF, 6mos and older(Flulaval,Afluria,Fluarix,Fluzone)   Recommended follow up:  Return in about 4 weeks (around 04/08/2024) for Weight loss, In Office. Future Appointments  Date Time Provider Department Center  04/08/2024 10:30 AM Lucius Krabbe, NP [facility]  03/17/2025  9:30 AM Lucius Krabbe, NP [facility]    Lab/Order associations:  fasting    Lucius Krabbe, NP

## 2024-03-11 NOTE — Patient Instructions (Addendum)
 It was very nice to see you today!   I will review your lab results via MyChart in a few days. I have sent in the phentermine to start for weight loss. Take 1/2 pill daily. Schedule a one month visit today.  Keep walking! Enjoy the fall season :-)      PLEASE NOTE:  If you had any lab tests please let us  know if you have not heard back within a few days. You may see your results on MyChart before we have a chance to review them but we will give you a call once they are reviewed by us . If we ordered any referrals today, please let us  know if you have not heard from their office within the next week.

## 2024-03-14 ENCOUNTER — Ambulatory Visit: Payer: Self-pay | Admitting: Family

## 2024-03-14 LAB — CYTOLOGY - PAP
Chlamydia: NEGATIVE
Comment: NEGATIVE
Comment: NEGATIVE
Comment: NORMAL
Diagnosis: NEGATIVE
Neisseria Gonorrhea: NEGATIVE
Trichomonas: NEGATIVE

## 2024-04-08 ENCOUNTER — Ambulatory Visit: Admitting: Family

## 2024-05-04 ENCOUNTER — Ambulatory Visit: Admitting: Family

## 2024-05-06 ENCOUNTER — Ambulatory Visit: Admitting: Family

## 2024-05-11 ENCOUNTER — Ambulatory Visit: Admitting: Family

## 2024-05-11 ENCOUNTER — Encounter: Payer: Self-pay | Admitting: Family

## 2024-05-11 VITALS — BP 124/70 | HR 64 | Temp 97.2°F | Ht 65.0 in | Wt 262.4 lb

## 2024-05-11 DIAGNOSIS — Z0289 Encounter for other administrative examinations: Secondary | ICD-10-CM

## 2024-05-11 DIAGNOSIS — N911 Secondary amenorrhea: Secondary | ICD-10-CM

## 2024-05-11 MED ORDER — PHENTERMINE HCL 37.5 MG PO TABS: 18.2500 mg | ORAL_TABLET | Freq: Two times a day (BID) | ORAL | 0 refills | Status: DC

## 2024-05-11 NOTE — Progress Notes (Signed)
 Patient ID: Madison Watson, female    DOB: [birth year], 18 y.o.   MRN: [id number]  Chief Complaint  Patient presents with   Morbid obesity  Discussed the use of AI scribe software for clinical note transcription with the patient, who gave verbal consent to proceed.  History of Present Illness Madison Watson is a 18 year old female who presents with difficulty managing weight and amenorrhea.  She has struggled with weight management and was prescribed phentermine . She tried a half-pill dose, which did not suppress her appetite and did not cause jitteriness or increased heart rate. She did not try a full-pill dose and stopped the medication due to lack of benefit.  She has had amenorrhea since May 2025. She has cramps without bleeding or spotting. Her grandmother, mother, aunt, and sister have irregular cycles. She takes a low dose of Prozac  for mood changes possibly related to hormonal fluctuations. She has not recently seen a gynecologist.  Assessment & Plan Morbid obesity Phentermine  previously ineffective; insurance does not cover [employer] . Considering weight management clinic. - Refilled phentermine  37.5mg  prescription. - Advised taking half a pill in the morning and half after lunch, ensuring evening meal is light with protein and vegetables. - Encouraged contacting weight management clinic for meal planning and exercise guidance. - Advised using a pill splitter for even splitting of pills.  Secondary amenorrhea Amenorrhea since May. Differential includes hormonal changes, PCOS, or other conditions. Mild cramping but no bleeding. - Referred to gynecology for evaluation. - Advised contacting gynecology clinic for appointment after 10d if not contacted. - Instructed to monitor for communication from clinic via MyChart message.   Subjective:    Outpatient Medications Prior to Visit  Medication Sig Dispense Refill   cetirizine  (ZYRTEC ) 10 MG tablet TAKE 1 TABLET BY MOUTH EVERY DAY  30 tablet 5   desonide  (DESOWEN ) 0.05 % ointment Apply 1 Application topically 2 (two) times daily. 60 g 2   Fluocinolone  Acetonide Body (DERMA-SMOOTHE /FS BODY) 0.01 % OIL Apply 1 Application topically daily as needed (Daily as needed for dandruff flars). 118.28 mL 1   FLUoxetine  (PROZAC ) 10 MG capsule Take 1 capsule (10 mg total) by mouth daily. 90 capsule 1   norethindrone-ethinyl estradiol-iron (LOESTRIN FE) 1.5-30 MG-MCG tablet Take 1 tablet by mouth daily. 28 tablet 11   triamcinolone  cream (KENALOG ) 0.5 % Apply 1 Application topically 2 (two) times daily. To affected areas. 45 g 3   phentermine  (ADIPEX-P ) 37.5 MG tablet Take 0.5 tablets (18.75 mg total) by mouth daily before breakfast. (Patient not taking: Reported on 05/11/2024) 15 tablet 0   No facility-administered medications prior to visit.   Past Medical History:  Diagnosis Date   ADD (attention deficit disorder) 01/18/2014   Diagnosed at [facility] in 2012.     Allergic rhinitis 01/18/2014   Birth control counseling 11/13/2022   Cheilitis due to atopic dermatitis 08/06/2023   Childhood asthma    Class 2 severe obesity due to excess calories with serious comorbidity and body mass index (BMI) of 36.0 to 36.9 in adult 11/13/2022   Dandruff 01/30/2023   Screen for sexually transmitted diseases 03/06/2023   History reviewed. No pertinent surgical history. Allergies[1]    Objective:    Physical Exam Vitals and nursing note reviewed.  Constitutional:      Appearance: Normal appearance. She is morbidly obese.  Cardiovascular:     Rate and Rhythm: Normal rate and regular rhythm.  Pulmonary:     Effort: Pulmonary effort is  normal.     Breath sounds: Normal breath sounds.  Musculoskeletal:        General: Normal range of motion.  Skin:    General: Skin is warm and dry.  Neurological:     Mental Status: She is alert.  Psychiatric:        Mood and Affect: Mood normal.        Behavior: Behavior normal.     BP 124/70 (BP Location: Left Arm, Patient Position: Sitting, Cuff Size: Large)   Pulse 64   Temp (!) 97.2 F (36.2 C) (Temporal)   Ht 5' 5 (1.651 m)   Wt 262 lb 6.4 oz (119 kg)   LMP 10/14/2023 (Approximate)   SpO2 99%   BMI 43.67 kg/m  Wt Readings from Last 3 Encounters:  05/11/24 262 lb 6.4 oz (119 kg)  03/11/24 255 lb 4 oz (115.8 kg)  01/18/24 248 lb (112.5 kg)      Lucius Krabbe, NP     [1] No Known Allergies

## 2024-05-15 ENCOUNTER — Other Ambulatory Visit (HOSPITAL_BASED_OUTPATIENT_CLINIC_OR_DEPARTMENT_OTHER): Payer: Self-pay | Admitting: Family

## 2024-05-31 ENCOUNTER — Ambulatory Visit (INDEPENDENT_AMBULATORY_CARE_PROVIDER_SITE_OTHER)

## 2024-05-31 DIAGNOSIS — Z111 Encounter for screening for respiratory tuberculosis: Secondary | ICD-10-CM

## 2024-06-01 ENCOUNTER — Encounter (INDEPENDENT_AMBULATORY_CARE_PROVIDER_SITE_OTHER): Payer: Self-pay | Admitting: Family Medicine

## 2024-06-01 ENCOUNTER — Ambulatory Visit (INDEPENDENT_AMBULATORY_CARE_PROVIDER_SITE_OTHER): Admitting: Family Medicine

## 2024-06-01 VITALS — BP 114/79 | HR 78 | Temp 98.2°F | Ht 65.0 in | Wt 255.0 lb

## 2024-06-01 DIAGNOSIS — F900 Attention-deficit hyperactivity disorder, predominantly inattentive type: Secondary | ICD-10-CM

## 2024-06-01 DIAGNOSIS — Z1331 Encounter for screening for depression: Secondary | ICD-10-CM

## 2024-06-01 DIAGNOSIS — E7800 Pure hypercholesterolemia, unspecified: Secondary | ICD-10-CM | POA: Diagnosis not present

## 2024-06-01 DIAGNOSIS — Z6841 Body Mass Index (BMI) 40.0 and over, adult: Secondary | ICD-10-CM | POA: Diagnosis not present

## 2024-06-01 DIAGNOSIS — F418 Other specified anxiety disorders: Secondary | ICD-10-CM | POA: Diagnosis not present

## 2024-06-01 DIAGNOSIS — F321 Major depressive disorder, single episode, moderate: Secondary | ICD-10-CM | POA: Diagnosis not present

## 2024-06-01 DIAGNOSIS — R0602 Shortness of breath: Secondary | ICD-10-CM | POA: Diagnosis not present

## 2024-06-01 DIAGNOSIS — R5383 Other fatigue: Secondary | ICD-10-CM | POA: Diagnosis not present

## 2024-06-01 NOTE — Progress Notes (Signed)
 "  Chief Complaint:  Obesity   Subjective:  Madison Watson (MR# [id number]) is a 19 y.o. female who presents for evaluation and treatment of obesity and related comorbidities.   Madison Watson is currently in the action stage of change and ready to dedicate time achieving and maintaining a healthier weight. Madison Watson is interested in becoming our patient and working on intensive lifestyle modifications including (but not limited to) diet and exercise for weight loss.  Madison Watson has been struggling with her weight. She has been unsuccessful in either losing weight, maintaining weight loss, or reaching her healthy weight goal.  Patient here to establish care.  She was started on phentermine  2 months ago but has not experienced much weight loss on this. 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  Lives at home with her mom Madison Watson, step father Madison Watson and sister Madison Watson.  Family is supportive of her and sometimes eats meals together.  Desired weight is 160lb- last time she was that weight was many years ago- patient cannot recall. Started gaining significant weight in 2020 due to stress.  Not eating out as much- eats at home.  She does grocery shopping for herself.  Biggest obstacle to cooking is motivation.  Tends to crave sweets.  Not skipping meal.   Food Recall: 1/4 cup cereal (Cinnamon Toast Crunch or Rice Krispies) with 2% or whole milk 1/2 cup and drinks afterwards.  Feels satisfied.  Snack of cheez its (individual bag) or Chobani flips- just wants to eat, may be one or both.  Lunch will be sandwich- mayo, cheese (1 slice), turkey (2-3 slices) and cheez its or fruit or chips (out of a big bag). Feels satisfied.  May have a juice with lunch. No snacks between lunch and dinner.  Dinner is spaghetti, green beans or broccoli and bread/ roll (1/2 cup spaghetti, 1/2 cup sauce and meat, 1/2 cup green beans) feels full from dinner.  May have a dessert after dinner like  ice cream or trail mix- individual ice cream, individual ziplock of trail mix.  May drink juice with dinner or tea.   Patient mentions she is emotionally eating 2-3 times a day.  She is aware of emotional eating when she is eating.   Indirect Calorimeter completed today shows a RMR: 2189.  Her calculated basal metabolic rate is 8018 thus her basal metabolic rate is better than expected.  Other Fatigue Madison Watson denies daytime somnolence and admits to waking up still tired. Patient has a history of symptoms of Epworth sleepiness scale. Madison Watson generally gets 8 hours of sleep per night, and states that she has generally restful sleep. Snoring is present. Apneic episodes are not present. Epworth Sleepiness Score is 10.   Shortness of Breath Madison Watson notes increasing shortness of breath with exercising and seems to be worsening over time with weight gain. She notes getting out of breath sooner with activity than she used to. This has not gotten worse recently. Madison Watson denies shortness of breath at rest or orthopnea.  Depression Screen Madison Watson's Food and Mood (modified PHQ-9) score was 2.     06/01/2024    8:53 AM  Depression screen PHQ 2/9  Decreased Interest 0  Down, Depressed, Hopeless 0  PHQ - 2 Score 0  Altered sleeping 0  Tired, decreased energy 1  Change in appetite 0  Feeling bad or failure about yourself  0  Trouble concentrating 0  Moving slowly or fidgety/restless 0  Suicidal thoughts  0  PHQ-9 Score 1  Difficult doing work/chores Not difficult at all     Objective:  Vitals Temp: 98.2 F (36.8 C) BP: 114/79 Pulse Rate: 78 SpO2: 97 %   Anthropometric Measurements Height: 5' 5 (1.651 m) Weight: 255 lb (115.7 kg) BMI (Calculated): 42.43 Starting Weight: 255 lb Waist Measurement : 46 inches   Body Composition  Body Fat %: 47.4 % Fat Mass (lbs): 121 lbs Muscle Mass (lbs): 127.6 lbs Total Body Water (lbs): 96 lbs Visceral Fat Rating : 11   Other Clinical Data RMR:  2189 Fasting: yes Labs: yes Today's Visit #: 1 Starting Date: 06/01/24 Comments: 1    EKG: Normal sinus rhythm, rate 69.  General: Cooperative, alert, well developed, in no acute distress. HEENT: Conjunctivae and lids unremarkable. Cardiovascular: Regular rhythm.  Lungs: Normal work of breathing. Neurologic: No focal deficits.   Lab Results  Component Value Date   CREATININE 0.82 03/11/2024   BUN 10 03/11/2024   NA 139 03/11/2024   K 3.8 03/11/2024   CL 107 03/11/2024   CO2 25 03/11/2024   Lab Results  Component Value Date   ALT 21 03/11/2024   AST 23 03/11/2024   ALKPHOS 42 03/11/2024   BILITOT 0.4 03/11/2024   No results found for: HGBA1C No results found for: INSULIN Lab Results  Component Value Date   TSH 1.37 03/11/2024   Lab Results  Component Value Date   CHOL 169 03/11/2024   HDL 58.60 03/11/2024   LDLCALC 85 03/11/2024   TRIG 130.0 03/11/2024   CHOLHDL 3 03/11/2024   Lab Results  Component Value Date   WBC 6.0 03/11/2024   HGB 13.5 03/11/2024   HCT 40.7 03/11/2024   MCV 89.4 03/11/2024   PLT 220.0 03/11/2024   No results found for: IRON, TIBC, FERRITIN  Assessment and Plan:  Assessment & Plan Other fatigue  SOBOE (shortness of breath on exertion)  Depression screening  Depression, major, single episode, moderate (HCC) Patient is tolerating fluoxetine  10 mg daily for treatment of her depression and anxiety.  At this time feels well-managed with no symptoms of depression.  No suicidal or homicidal ideation reported.  Defer to prescriber for treatment Situational anxiety Situational anxiety has improved.  Patient is on low-dose Prozac  at this time. Hypercholesterolemia LDL and triglycerides elevated previously but better controlled on recent labs in October- will repeat labs in April Attention deficit hyperactivity disorder (ADHD), predominantly inattentive type Previously on Vyvanse but stopped this during high school because  she didn't feel she needed it anymore.  She was also on Concerta  but felt it didn't help as much as Vyvanse.  Not on medications currently.  Will follow on symptom control at next appointment. BMI 40.0-44.9, adult (HCC)  Morbid obesity (HCC)    Other Fatigue  Madison Watson does feel that her weight is causing her energy to be lower than it should be. Fatigue may be related to obesity, depression or many other causes. Labs will be ordered, and in the meanwhile, Madison Watson will focus on self care including making healthy food choices, increasing physical activity and focusing on stress reduction.  Shortness of Breath  Madison Watson does feel that she gets out of breath more easily that she used to when she exercises. Madison Watson's shortness of breath appears to be obesity related and exercise induced. She has agreed to work on weight loss and gradually increase exercise to treat her exercise induced shortness of breath. Will continue to monitor closely.   Problem List Items  Addressed This Visit       Other   Morbid obesity (HCC)   Other Visit Diagnoses       Other fatigue    -  Primary   Relevant Orders   EKG 12-Lead   T4, free   T3   Hemoglobin A1c   Insulin, random   VITAMIN D 25 Hydroxy (Vit-D Deficiency, Fractures)     SOBOE (shortness of breath on exertion)       Relevant Orders   EKG 12-Lead     Depression screening         Depression, major, single episode, moderate (HCC)         Situational anxiety         Hypercholesterolemia         Attention deficit hyperactivity disorder (ADHD), predominantly inattentive type         BMI 40.0-44.9, adult (HCC)           Madison Watson is currently in the action stage of change and her goal is to continue with weight loss efforts. I recommend Madison Watson begin the structured treatment plan as follows:  She has agreed to Category 4 Plan  Exercise goals: All adults should avoid inactivity. Some activity is better than none, and adults who participate in any amount of  physical activity, gain some health benefits.  Behavioral modification strategies:increasing lean protein intake, increase H2O intake, decrease liquid calories, and meal planning and cooking strategies  She was informed of the importance of frequent follow-up visits to maximize her success with intensive lifestyle modifications for her multiple health conditions. She was informed we would discuss her lab results at her next visit unless there is a critical issue that needs to be addressed sooner. Madison Watson agreed to keep her next visit at the agreed upon time to discuss these results.  Labs ordered with plans to discuss at the next visit.   Attestation Statements:  Reviewed by clinician on day of visit: allergies, medications, problem list, medical history, surgical history, family history, social history, and previous encounter notes. This is the patient's first visit at [facility]. The patient's NEW PATIENT PACKET was reviewed at length. Included in the packet: current and past health history, medications, allergies, ROS, gynecologic history (women only), surgical history, family history, social history, weight history, weight loss surgery history (for those that have had weight loss surgery), nutritional evaluation, mood and food questionnaire, PHQ9, Epworth questionnaire, sleep habits questionnaire, patient life and health improvement goals questionnaire. These will all be scanned into the patient's chart under media.   During the visit, I independently reviewed the patient's EKG, bioimpedance scale results, and indirect calorimeter results. I used this information to tailor a meal plan for the patient that will help her to lose weight and will improve her obesity-related conditions going forward. I performed a medically necessary appropriate examination and/or evaluation. I discussed the assessment and treatment plan with the patient. The patient was provided an opportunity to ask  questions and all were answered. The patient agreed with the plan and demonstrated an understanding of the instructions. Labs were ordered at this visit and will be reviewed at the next visit unless more critical results need to be addressed immediately. Clinical information was updated and documented in the EMR.   Adelita Cho, MD  "

## 2024-06-02 ENCOUNTER — Ambulatory Visit

## 2024-06-02 LAB — T4, FREE: Free T4: 1.17 ng/dL (ref 0.82–1.77)

## 2024-06-02 LAB — QUANTIFERON-TB GOLD PLUS
Mitogen-NIL: >10 [IU]/mL
NIL: 0.04 [IU]/mL
QuantiFERON-TB Gold Plus: NEGATIVE
TB1-NIL: 0 [IU]/mL
TB2-NIL: 0.01 [IU]/mL

## 2024-06-02 LAB — HEMOGLOBIN A1C
Est. average glucose Bld gHb Est-mCnc: 108 mg/dL
Hgb A1c MFr Bld: 5.4 % (ref 4.8–5.6)

## 2024-06-02 LAB — VITAMIN D 25 HYDROXY (VIT D DEFICIENCY, FRACTURES): Vit D, 25-Hydroxy: 12 ng/mL — ABNORMAL LOW (ref 30.0–100.0)

## 2024-06-02 LAB — T3: T3, Total: 161 ng/dL (ref 71–180)

## 2024-06-02 LAB — INSULIN, RANDOM: INSULIN: 127 u[IU]/mL — ABNORMAL HIGH (ref 2.6–24.9)

## 2024-06-03 ENCOUNTER — Ambulatory Visit: Payer: Self-pay | Admitting: Family

## 2024-06-14 ENCOUNTER — Ambulatory Visit (INDEPENDENT_AMBULATORY_CARE_PROVIDER_SITE_OTHER): Admitting: Family Medicine

## 2024-06-15 ENCOUNTER — Ambulatory Visit (INDEPENDENT_AMBULATORY_CARE_PROVIDER_SITE_OTHER): Admitting: Family Medicine

## 2024-06-22 ENCOUNTER — Ambulatory Visit (INDEPENDENT_AMBULATORY_CARE_PROVIDER_SITE_OTHER): Admitting: Family Medicine

## 2024-06-24 ENCOUNTER — Ambulatory Visit (INDEPENDENT_AMBULATORY_CARE_PROVIDER_SITE_OTHER): Admitting: Family Medicine

## 2024-06-24 ENCOUNTER — Encounter (INDEPENDENT_AMBULATORY_CARE_PROVIDER_SITE_OTHER): Payer: Self-pay | Admitting: Family Medicine

## 2024-06-24 DIAGNOSIS — E559 Vitamin D deficiency, unspecified: Secondary | ICD-10-CM | POA: Diagnosis not present

## 2024-06-24 DIAGNOSIS — E88819 Insulin resistance, unspecified: Secondary | ICD-10-CM | POA: Diagnosis not present

## 2024-06-24 DIAGNOSIS — Z6841 Body Mass Index (BMI) 40.0 and over, adult: Secondary | ICD-10-CM | POA: Diagnosis not present

## 2024-06-24 MED ORDER — VITAMIN D (ERGOCALCIFEROL) 1.25 MG (50000 UNIT) PO CAPS: 50000.0000 [IU] | ORAL_CAPSULE | ORAL | 0 refills | Status: AC

## 2024-06-24 MED ORDER — METFORMIN HCL 500 MG PO TABS: 500.0000 mg | ORAL_TABLET | Freq: Every day | ORAL | 0 refills | Status: AC

## 2024-06-24 NOTE — Progress Notes (Signed)
 "  SUBJECTIVE:  Chief Complaint: Obesity  Interim History: First few weeks went well for patient.  She started weighing her food and got about 2-3 things from the plan in. She then got busy and somewhat tapered off with consistency of eating on plan.  She started exercising as well and was trying to do 2-3 times a week.  Things she noticed when she was on plan was she was limiting her portions which made her feel better.  She was really trying to control her total intake.  Only upcoming obligations are school. Upon review breakfast relatively easy to follow, lunch is food patient eats and dinner is also going to be easy for patient to stay consistent with.    Madison Watson is here to discuss her progress with her obesity treatment plan. She is on the Category 4 Plan and states she is following her eating plan approximately 80 % of the time. She states she is exercising 60 minutes 3 times per week.   OBJECTIVE: Visit Diagnoses: Problem List Items Addressed This Visit       Other   Morbid obesity (HCC) - Primary   Relevant Medications   metFORMIN  (GLUCOPHAGE ) 500 MG tablet   Other Visit Diagnoses       Insulin resistance       Relevant Medications   metFORMIN  (GLUCOPHAGE ) 500 MG tablet     Vitamin D  deficiency       Relevant Medications   Vitamin D , Ergocalciferol , (DRISDOL ) 1.25 MG (50000 UNIT) CAPS capsule     BMI 40.0-44.9, adult (HCC)       Relevant Medications   metFORMIN  (GLUCOPHAGE ) 500 MG tablet       Vitals Temp: 98.8 F (37.1 C) BP: 125/85 Pulse Rate: 74 SpO2: 97 %   Anthropometric Measurements Height: 5' 5 (1.651 m) Weight: 254 lb (115.2 kg) BMI (Calculated): 42.27 Weight at Last Visit: 255 lb Weight Lost Since Last Visit: 1 Weight Gained Since Last Visit: 0 Starting Weight: 255 lb Total Weight Loss (lbs): 1 lb (0.454 kg)   Body Composition  Body Fat %: 47.3 % Fat Mass (lbs): 120.6 lbs Muscle Mass (lbs): 127.4 lbs Total Body Water (lbs): 95.2 lbs Visceral  Fat Rating : 11   Other Clinical Data Today's Visit #: 2 Starting Date: 06/01/24 Comments: Cat 4     ASSESSMENT AND PLAN: Assessment & Plan Insulin resistance Pathophysiology of progression through insulin resistance to prediabetes and diabetes was discussed at length today.  Patient to continue to monitor and be in control of total intake of snack calories which may be simple carbohydrates but should be consumed only after the patient has taken in all the nutrition for the day.  Macronutrient identification, classification and daily intake ratios were discussed.  Plan to repeat labs in 3 months to monitor both hemoglobin A1c and insulin levels.  Patient is agreeable to trying metformin  for insulin resistance.  Discussed possible side effects, mechanism of action and instructions for taking it. Plan to discuss response to medication at next appointment.   Vitamin D  deficiency Discussed importance of vitamin d  supplementation.  Vitamin d  supplementation has been shown to decrease fatigue, decrease risk of progression to insulin resistance and then prediabetes, decreases risk of falling in older age and can even assist in decreasing depressive symptoms in PTSD.   Prescription for Vitamin D  sent in.   BMI 40.0-44.9, adult [facility])  Morbid obesity (HCC)    Diet: Bryanda is currently in the action stage of change.  As such, her goal is to continue with weight loss efforts and has agreed to the Category 4 Plan.   Exercise:  All adults should avoid inactivity. Some activity is better than none, and adults who participate in any amount of physical activity, gain some health benefits.  Behavior Modification:  We discussed the following Behavioral Modification Strategies today: increasing lean protein intake, decreasing simple carbohydrates, increasing vegetables, meal planning and cooking strategies, keeping healthy foods in the home, and planning for success.   Return in about 3 weeks (around  07/15/2024).   She was informed of the importance of frequent follow up visits to maximize her success with intensive lifestyle modifications for her multiple health conditions.  Attestation Statements:   Reviewed by clinician on day of visit: allergies, medications, problem list, medical history, surgical history, family history, social history, and previous encounter notes.     Adelita Cho, MD "

## 2024-07-20 ENCOUNTER — Ambulatory Visit (INDEPENDENT_AMBULATORY_CARE_PROVIDER_SITE_OTHER): Admitting: Family Medicine

## 2025-03-17 ENCOUNTER — Encounter: Admitting: Family
